# Patient Record
Sex: Male | Born: 1975
Health system: Southern US, Community
[De-identification: ages and names within clinical notes are randomized; demographics above are authoritative.]

## PROBLEM LIST (undated history)

## (undated) DIAGNOSIS — G904 Autonomic dysreflexia: Secondary | ICD-10-CM

## (undated) DIAGNOSIS — G8220 Paraplegia, unspecified: Secondary | ICD-10-CM

## (undated) DIAGNOSIS — N319 Neuromuscular dysfunction of bladder, unspecified: Secondary | ICD-10-CM

## (undated) DIAGNOSIS — S14105A Unspecified injury at C5 level of cervical spinal cord, initial encounter: Secondary | ICD-10-CM

## (undated) DIAGNOSIS — K592 Neurogenic bowel, not elsewhere classified: Secondary | ICD-10-CM

## (undated) HISTORY — PX: ABDOMINAL SURGERY: SHX537

## (undated) HISTORY — DX: Neuromuscular dysfunction of bladder, unspecified: N31.9

## (undated) HISTORY — DX: Autonomic dysreflexia: G90.4

## (undated) HISTORY — PX: BACK SURGERY: SHX140

## (undated) HISTORY — DX: Neurogenic bowel, not elsewhere classified: K59.2

---

## 1998-10-27 ENCOUNTER — Encounter: Payer: Self-pay | Admitting: Emergency Medicine

## 1998-10-27 ENCOUNTER — Inpatient Hospital Stay (HOSPITAL_COMMUNITY): Admission: EM | Admit: 1998-10-27 | Discharge: 1998-12-15 | Payer: Self-pay | Admitting: Emergency Medicine

## 1998-10-27 ENCOUNTER — Encounter: Payer: Self-pay | Admitting: Neurosurgery

## 1998-10-28 ENCOUNTER — Encounter: Payer: Self-pay | Admitting: General Surgery

## 1998-10-28 ENCOUNTER — Encounter: Payer: Self-pay | Admitting: Neurosurgery

## 1998-10-29 ENCOUNTER — Encounter: Payer: Self-pay | Admitting: General Surgery

## 1998-10-29 ENCOUNTER — Encounter: Payer: Self-pay | Admitting: Neurosurgery

## 1998-10-30 ENCOUNTER — Encounter: Payer: Self-pay | Admitting: Critical Care Medicine

## 1998-10-30 ENCOUNTER — Encounter: Payer: Self-pay | Admitting: General Surgery

## 1998-10-31 ENCOUNTER — Encounter: Payer: Self-pay | Admitting: Neurosurgery

## 1998-11-01 ENCOUNTER — Encounter: Payer: Self-pay | Admitting: Critical Care Medicine

## 1998-11-01 ENCOUNTER — Encounter: Payer: Self-pay | Admitting: Neurosurgery

## 1998-11-04 ENCOUNTER — Encounter: Payer: Self-pay | Admitting: General Surgery

## 1998-11-05 ENCOUNTER — Encounter: Payer: Self-pay | Admitting: Critical Care Medicine

## 1998-11-06 ENCOUNTER — Encounter: Payer: Self-pay | Admitting: General Surgery

## 1998-11-12 ENCOUNTER — Encounter: Payer: Self-pay | Admitting: Neurosurgery

## 1998-11-12 ENCOUNTER — Encounter: Payer: Self-pay | Admitting: General Surgery

## 1998-11-14 ENCOUNTER — Encounter: Payer: Self-pay | Admitting: General Surgery

## 1998-11-14 ENCOUNTER — Encounter: Payer: Self-pay | Admitting: Neurosurgery

## 1998-11-19 ENCOUNTER — Encounter: Payer: Self-pay | Admitting: General Surgery

## 1998-11-22 ENCOUNTER — Encounter: Payer: Self-pay | Admitting: Pulmonary Disease

## 1998-11-29 ENCOUNTER — Encounter: Payer: Self-pay | Admitting: General Surgery

## 1998-11-30 ENCOUNTER — Encounter: Payer: Self-pay | Admitting: General Surgery

## 1998-12-02 ENCOUNTER — Encounter: Payer: Self-pay | Admitting: Pulmonary Disease

## 1998-12-12 ENCOUNTER — Encounter: Payer: Self-pay | Admitting: General Surgery

## 1998-12-16 ENCOUNTER — Inpatient Hospital Stay (HOSPITAL_COMMUNITY)
Admission: RE | Admit: 1998-12-16 | Discharge: 1999-02-18 | Payer: Self-pay | Admitting: Physical Medicine and Rehabilitation

## 1999-01-01 ENCOUNTER — Encounter: Payer: Self-pay | Admitting: Physical Medicine and Rehabilitation

## 1999-01-14 ENCOUNTER — Encounter: Payer: Self-pay | Admitting: Physical Medicine and Rehabilitation

## 1999-03-28 ENCOUNTER — Encounter
Admission: RE | Admit: 1999-03-28 | Discharge: 1999-06-26 | Payer: Self-pay | Admitting: Physical Medicine and Rehabilitation

## 2004-05-06 ENCOUNTER — Encounter
Admission: RE | Admit: 2004-05-06 | Discharge: 2004-08-04 | Payer: Self-pay | Admitting: Physical Medicine & Rehabilitation

## 2004-05-07 ENCOUNTER — Ambulatory Visit: Payer: Self-pay | Admitting: Physical Medicine & Rehabilitation

## 2004-05-09 ENCOUNTER — Ambulatory Visit (HOSPITAL_COMMUNITY)
Admission: RE | Admit: 2004-05-09 | Discharge: 2004-05-09 | Payer: Self-pay | Admitting: Physical Medicine & Rehabilitation

## 2004-06-25 ENCOUNTER — Ambulatory Visit: Payer: Self-pay | Admitting: Physical Medicine & Rehabilitation

## 2004-10-20 ENCOUNTER — Encounter
Admission: RE | Admit: 2004-10-20 | Discharge: 2005-01-18 | Payer: Self-pay | Admitting: Physical Medicine & Rehabilitation

## 2004-10-22 ENCOUNTER — Ambulatory Visit: Payer: Self-pay | Admitting: Physical Medicine & Rehabilitation

## 2005-04-17 ENCOUNTER — Encounter
Admission: RE | Admit: 2005-04-17 | Discharge: 2005-07-16 | Payer: Self-pay | Admitting: Physical Medicine & Rehabilitation

## 2005-05-15 ENCOUNTER — Ambulatory Visit: Payer: Self-pay | Admitting: Physical Medicine & Rehabilitation

## 2005-10-28 ENCOUNTER — Encounter
Admission: RE | Admit: 2005-10-28 | Discharge: 2006-01-26 | Payer: Self-pay | Admitting: Physical Medicine & Rehabilitation

## 2005-11-20 ENCOUNTER — Ambulatory Visit: Payer: Self-pay | Admitting: Physical Medicine & Rehabilitation

## 2006-05-05 ENCOUNTER — Encounter
Admission: RE | Admit: 2006-05-05 | Discharge: 2006-08-03 | Payer: Self-pay | Admitting: Physical Medicine & Rehabilitation

## 2006-05-14 ENCOUNTER — Ambulatory Visit: Payer: Self-pay | Admitting: Physical Medicine & Rehabilitation

## 2006-11-10 ENCOUNTER — Encounter
Admission: RE | Admit: 2006-11-10 | Discharge: 2007-02-08 | Payer: Self-pay | Admitting: Physical Medicine & Rehabilitation

## 2006-11-10 ENCOUNTER — Ambulatory Visit: Payer: Self-pay | Admitting: Physical Medicine & Rehabilitation

## 2007-04-26 ENCOUNTER — Ambulatory Visit: Payer: Self-pay | Admitting: Physical Medicine & Rehabilitation

## 2007-04-26 ENCOUNTER — Encounter
Admission: RE | Admit: 2007-04-26 | Discharge: 2007-04-28 | Payer: Self-pay | Admitting: Physical Medicine & Rehabilitation

## 2007-10-14 ENCOUNTER — Encounter
Admission: RE | Admit: 2007-10-14 | Discharge: 2007-10-19 | Payer: Self-pay | Admitting: Physical Medicine & Rehabilitation

## 2007-10-19 ENCOUNTER — Ambulatory Visit: Payer: Self-pay | Admitting: Physical Medicine & Rehabilitation

## 2008-02-16 ENCOUNTER — Encounter
Admission: RE | Admit: 2008-02-16 | Discharge: 2008-03-15 | Payer: Self-pay | Admitting: Physical Medicine & Rehabilitation

## 2008-02-17 ENCOUNTER — Ambulatory Visit: Payer: Self-pay | Admitting: Physical Medicine & Rehabilitation

## 2008-03-15 ENCOUNTER — Ambulatory Visit: Payer: Self-pay | Admitting: Physical Medicine & Rehabilitation

## 2008-09-12 ENCOUNTER — Encounter
Admission: RE | Admit: 2008-09-12 | Discharge: 2008-09-18 | Payer: Self-pay | Admitting: Physical Medicine & Rehabilitation

## 2008-09-18 ENCOUNTER — Ambulatory Visit: Payer: Self-pay | Admitting: Physical Medicine & Rehabilitation

## 2009-03-11 ENCOUNTER — Encounter
Admission: RE | Admit: 2009-03-11 | Discharge: 2009-06-05 | Payer: Self-pay | Admitting: Physical Medicine & Rehabilitation

## 2009-03-26 ENCOUNTER — Ambulatory Visit: Payer: Self-pay | Admitting: Physical Medicine & Rehabilitation

## 2009-09-17 ENCOUNTER — Encounter
Admission: RE | Admit: 2009-09-17 | Discharge: 2009-09-20 | Payer: Self-pay | Admitting: Physical Medicine & Rehabilitation

## 2009-09-20 ENCOUNTER — Ambulatory Visit: Payer: Self-pay | Admitting: Physical Medicine & Rehabilitation

## 2010-03-03 ENCOUNTER — Encounter
Admission: RE | Admit: 2010-03-03 | Discharge: 2010-05-28 | Payer: Self-pay | Source: Home / Self Care | Attending: Internal Medicine | Admitting: Internal Medicine

## 2010-03-07 ENCOUNTER — Encounter
Admission: RE | Admit: 2010-03-07 | Discharge: 2010-03-13 | Payer: Self-pay | Source: Home / Self Care | Attending: Physical Medicine & Rehabilitation | Admitting: Physical Medicine & Rehabilitation

## 2010-03-13 ENCOUNTER — Ambulatory Visit: Payer: Self-pay | Admitting: Physical Medicine & Rehabilitation

## 2010-05-28 ENCOUNTER — Encounter: Admission: RE | Admit: 2010-05-28 | Payer: Self-pay | Source: Home / Self Care | Admitting: Internal Medicine

## 2010-09-09 ENCOUNTER — Ambulatory Visit: Payer: Medicare Other | Admitting: Physical Medicine & Rehabilitation

## 2010-09-09 ENCOUNTER — Encounter: Payer: Medicare Other | Attending: Physical Medicine & Rehabilitation
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
## 2010-10-24 NOTE — Assessment & Plan Note (Signed)
[name] returns to clinic today for followup evaluation.  He reports  that overall he continues to do well.  He is being fitted for a new power  wheelchair, but no other equipment issues are pending at this time.  He has  finished his semester in his computer college course through [employer].  He is taking psychology and finished with a 3.25 grade point  average.  He reports he will be taking classes this summer.   In terms of the skin, he reports no breakdown.   In terms of his bladder, continues to use briefs and changes two to three  times per day.  He is having no significant urinary tract infections.   In terms of his bowels, he uses a suppository and has daily bowel movements.   During the last clinic visit, we had added Tramadol to be used on an as-  needed basis for his low back pain.  He reports that he takes a very minimal  amount and still has approximately 30 tablets left from the prescription of  90 given to him June 25, 2004.  He is very sporadic in his use but  occasionally does use it on a daily basis but for only a few days at a time.  At other times, he goes two and three weeks without using any medication.   MEDICATIONS:  1.  Tramadol 50 mg 1 tablet t.i.d. p.r.n. (0 to 3 per day).  2.  Advil p.r.n.   CLINICAL EXAMINATION:  GENERAL:  Well-appearing, fit, adult male seated in a  power wheelchair.  VITAL SIGNS:  Blood pressure 75/49, pulse 67, respiratory rate 16, O2  saturation 97% on room air.  NEUROLOGIC:  He has 4+/5 strength at the shoulders bilaterally.  Biceps were  4+/5, and grip was 1/5 along with triceps 0/5.  Lower extremity strength was  0/5 throughout.   IMPRESSION:  1.  History of cervical fracture at C5 with resultant complete quadriplegia.  2.  Probable mechanical low back pain.   PLAN:  1.  In the office today, the patient does not need a refill on Ultram.  He      will call me for refill as he gets down, and then we will refill  it by      phone.  2.  We will plan on seeing him in followup in approximately six months time.      He continues to have ongoing medical issues and is rather stable from      his cervical spinal cord injury.      DC/MedQ  D:  10/22/2004 12:36:08  T:  10/22/2004 12:52:25  Job #:  [id number]

## 2010-10-24 NOTE — Assessment & Plan Note (Signed)
INTERVAL HISTORY:  Christopher Valencia returns to clinic today for followup  evaluation.  He reports that he is doing well overall.  He continues to take  classes through computer-based program at [employer] and he has  approximately 40 of 120 credits that he needs for a psychology degree.  He  also plans to attend graduate school if his grade point average is up to a  certain level.   In terms of his bowel and bladder, he reports that all function is normal  for him.  He continues to use briefs and change them two to three times per  day.  He uses a suppository for daily bowel program.  He continues to have  no problems with his skin and uses a power chair.  He does have help from a  caregiver who is actually his brother through the [employer] program.  The brother  is in for him and helps him 7 hours per day, 5 days per week.   The patient does continue to take the tramadol anywhere from 0 to 3 tablets  per day.   MEDICATIONS:  1.  Tramadol 50 mg one tablet t.i.d. p.r.n. (0-3 per day).  2.  Advil p.r.n.   PHYSICAL EXAMINATION:  Reasonably well-appearing fit adult male seated in a  power wheelchair.  Blood pressure 130/62 with the pulse 64, respiratory rate  16 and O2 saturation 93% on room air.  4+/5 strength was present in his  bilateral shoulders with biceps strength of 4+/5 and grip of 1/5.  Lower  extremity strength was 0/5 throughout.   IMPRESSION:  1.  History of cervical fracture C5 with resultant complete quadriplegia.  2.  Mechanical low back pain.   In the office today, no refill on medication is necessary.  We will plan on  seeing him in followup in approximately 6 months' time with refill of  medication prior to that appointment as necessary.           ______________________________  Ellwood Dense, M.D.     DC/MedQ  D:  05/15/2005 15:04:32  T:  05/15/2005 21:17:35  Job #:  [id number]
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
## 2010-10-24 NOTE — Assessment & Plan Note (Signed)
Christopher Valencia returns to clinic today for follow up evaluation. He reports  that he is doing well over all. He continues to finish this recent  semester in computer classes that he is taking through [employer]. He is doing well overall.  His health is unchanged. His is urinating on his own and having regular  bowel movements on his own. He reports that he has had no skin breakdown  and his equipment is working well for him at the present time. He does  use tramadol only on periodic basis i.e. up to 1 tablet per day and he  has sufficient supply at this time.   MEDICATIONS:  1. Tramadol 50 mg 1 tablet t.i.d. p.r.n.0 to 1 per day.   REVIEW OF SYSTEMS:  Noncontributory.   PHYSICAL EXAMINATION:  Well-appearing fit, adult male sitting in a power  wheelchair. Blood pressure 104/62, with a pulse of 57, respiratory rate  of 16, and O2 saturation 97% on room air. He has 4+/5 strength  proximally, and 1/5 strength in his bilateral upper extremities. Lower  extremity strength was 0/5.   IMPRESSION:  1. History of cervical fracture at C5 with result in complete      quadriplegia.  2. Mechanical low back pain.   In the office today no refill of medications is necessary. He is doing  well overall. No need in change of his bowel or bladder program or any  treatment for skin breakdown. We will plan on seeing him in followup in  approximately 6 months time.           ______________________________  Ellwood Dense, M.D.     DC/MedQ  D:  05/17/2006 11:33:50  T:  05/17/2006 15:46:05  Job #:  [id number]

## 2010-10-24 NOTE — Assessment & Plan Note (Signed)
MEDICAL RECORD NUMBER:  [id number].   [name] returns to clinic today for followup evaluation. During the last  clinic visit May 07, 2004, the patient had reported good relief with  only minimal use of Advil. No other prescriptions were given at that time.  He does repot now that the Advil is helping most of the time, but he would  like something to be used on days when the Advil does not give him relief.  He also reports that he has used some Skelaxin but that has not be helpful  for him in the past. He did undergo a recent MRI scan of his lumbar spine  May 09, 2004. He was given a copy of that in the office today. The  impression was annular rent on the left at L5-S1 but no associated disk  protrusion. There was no focal disk protrusion, spinal, or foraminal  stenosis in the lumbar spine. There was no evidence of sacral stress  fracture or SI joint abnormality.   MEDICATIONS:  Advil p.r.n.   PHYSICAL EXAMINATION:  Reasonably well appearing adult male seated in power  wheelchair. Blood pressure 90/50 with a pulse of 63, respiratory rate 16,  and O2 saturation 97% on room air. He has 4+/5 strength at the shoulders  bilaterally. Biceps were 4+/5, and the wrist was 4/5. Grip was 1 to 1/5 and  triceps 0/5. Lower extremity strength was 0/5 throughout.   IMPRESSION:  1.  History of cervical fracture at C5 with resultant complete quadriplegia.  2.  Probable mechanical low back pain.   At this point, the MRI scan is fairly unremarkable for this individual. We  have had him continue the Advil, but I have given him a new prescription for  Tramadol to be used 1 tablet 3 times a day as needed, 50 mg. He continues to  have no changes in his bowel or bladder function. He is regularly voiding on  his own and has regular bowel movements at this point. We will plan on  seeing in followup in approximately four months' time or before if  necessary.       DC/MedQ  D:  06/25/2004  14:05:40  T:  06/25/2004 14:50:55  Job #:  [id number]

## 2010-12-16 ENCOUNTER — Encounter: Payer: Medicare Other | Attending: Neurosurgery | Admitting: Neurosurgery

## 2010-12-16 DIAGNOSIS — M545 Low back pain, unspecified: Secondary | ICD-10-CM

## 2010-12-16 DIAGNOSIS — X58XXXS Exposure to other specified factors, sequela: Secondary | ICD-10-CM | POA: Insufficient documentation

## 2010-12-16 DIAGNOSIS — N319 Neuromuscular dysfunction of bladder, unspecified: Secondary | ICD-10-CM | POA: Insufficient documentation

## 2010-12-16 DIAGNOSIS — Z981 Arthrodesis status: Secondary | ICD-10-CM | POA: Insufficient documentation

## 2010-12-16 DIAGNOSIS — G8254 Quadriplegia, C5-C7 incomplete: Secondary | ICD-10-CM | POA: Insufficient documentation

## 2010-12-16 DIAGNOSIS — K592 Neurogenic bowel, not elsewhere classified: Secondary | ICD-10-CM | POA: Insufficient documentation

## 2010-12-16 DIAGNOSIS — IMO0002 Reserved for concepts with insufficient information to code with codable children: Secondary | ICD-10-CM | POA: Insufficient documentation

## 2010-12-17 NOTE — Assessment & Plan Note (Signed)
A 35 year old male with a history of a C6 incomplete quadriplegia is followed by Dr. Wynn Banker for a low back pain.  He has had a C3-6 fusion with Dr. Phoebe Perch in the past.  Otherwise, he reports no change in his condition.  He takes tramadol and does okay with that.  His pain level is about 2-3, sharp pain, that is consistent.  Sitting in activity tend to aggravate.  Therapy, rest, and medication tend to help.  Pain is worse in the daytime and the evening.  Mobility is in a wheelchair motorized.  REVIEW OF SYSTEMS:  Notable for difficulties described above as well as some bowel and bladder problems, some weakness and paresthesias, spasms. No suicidal thoughts or aberrant behaviors.  PAST MEDICAL HISTORY, SOCIAL HISTORY AND FAMILY HISTORY:  Unchanged.  Physical exam; his blood pressure is 81/48, pulse 63, respirations 18, O2 sats 99 on room air.  He is about 2/5 in his triceps, 4/5 in the biceps, nothing in the lower extremities.  All sensation is diminished in upper and lower.  Constitutionally, he is within normal limits. Alert and oriented x3.  Affect is bright.  He is in a motorized chair.  IMPRESSION:  C6 incomplete spinal cord injury and neurogenic bowel and bladder.  PLAN: 1. Continue his tramadol 50 mg q.i.d. for back pain 120 5 refills. 2. Continue his other medications. 3. Follow up with Urology for bladder problems. 4. He will follow up here in the clinic in 6 months.  His questions     were encouraged and answered.     Zelda Reames L. Blima Dessert Electronically Signed    RLW/MedQ D:  12/16/2010 13:05:42  T:  12/17/2010 02:01:43  Job #:  [id number]

## 2011-05-13 ENCOUNTER — Encounter: Payer: Self-pay | Admitting: Family Medicine

## 2011-05-13 ENCOUNTER — Emergency Department (HOSPITAL_BASED_OUTPATIENT_CLINIC_OR_DEPARTMENT_OTHER)
Admission: EM | Admit: 2011-05-13 | Discharge: 2011-05-13 | Disposition: A | Discharge status: Home / Self Care | Payer: Medicare Other | Attending: Emergency Medicine | Admitting: Emergency Medicine

## 2011-05-13 DIAGNOSIS — R197 Diarrhea, unspecified: Secondary | ICD-10-CM

## 2011-05-13 HISTORY — DX: Unspecified injury at C5 level of cervical spinal cord, initial encounter: S14.105A

## 2011-05-13 LAB — COMPREHENSIVE METABOLIC PANEL
AST: 21 U/L (ref 0–37)
CO2: 24 mEq/L (ref 19–32)
Calcium: 9.1 mg/dL (ref 8.4–10.5)
Creatinine, Ser: 0.7 mg/dL (ref 0.50–1.35)
GFR calc Af Amer: >90 mL/min (ref >90)
GFR calc non Af Amer: >90 mL/min (ref >90)
Glucose, Bld: 80 mg/dL (ref 70–99)
Total Protein: 7 g/dL (ref 6.0–8.3)

## 2011-05-13 LAB — URINALYSIS, ROUTINE W REFLEX MICROSCOPIC
Nitrite: NEGATIVE
Specific Gravity, Urine: 1.015 (ref 1.005–1.030)
Urobilinogen, UA: 0.2 mg/dL (ref 0.0–1.0)
pH: 7 (ref 5.0–8.0)

## 2011-05-13 LAB — DIFFERENTIAL
Basophils Absolute: 0 10*3/uL (ref 0.0–0.1)
Eosinophils Absolute: 0.2 10*3/uL (ref 0.0–0.7)
Eosinophils Relative: 3 % (ref 0–5)
Lymphocytes Relative: 22 % (ref 12–46)
Monocytes Absolute: 0.5 10*3/uL (ref 0.1–1.0)

## 2011-05-13 LAB — CBC
HCT: 38.4 % — ABNORMAL LOW (ref 39.0–52.0)
MCH: 27 pg (ref 26.0–34.0)
MCV: 81 fL (ref 78.0–100.0)
Platelets: 222 10*3/uL (ref 150–400)
RDW: 14.8 % (ref 11.5–15.5)
WBC: 4.8 10*3/uL (ref 4.0–10.5)

## 2011-05-13 MED ORDER — SODIUM CHLORIDE 0.9 % IV BOLUS (SEPSIS)
1000.0000 mL | Freq: Once | INTRAVENOUS | Status: AC
  Administered 2011-05-13: 1000 mL via INTRAVENOUS

## 2011-05-13 NOTE — ED Provider Notes (Signed)
Medical screening examination/treatment/procedure(s) were conducted as a shared visit with non-physician practitioner(s) and myself.  I personally evaluated the patient during the encounter   Christopher Valencia A. Patrica Duel, MD 05/13/11 [phone]

## 2011-05-13 NOTE — ED Notes (Signed)
Pt c/o "diarrhea x 4 days and my CNA noticed blood in it this morning". Pt sts he recently started taking fish oil and vitamins about a week ago. Pt denies n/v, fever.

## 2011-05-13 NOTE — ED Notes (Signed)
Pt had no diarrhea while in ED
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
## 2011-05-13 NOTE — ED Provider Notes (Signed)
History     CSN: [id number] Arrival date & time: 05/13/2011  1:41 PM   First MD Initiated Contact with Patient 05/13/11 1349      Chief Complaint  Patient presents with  . Diarrhea    (Consider location/radiation/quality/duration/timing/severity/associated sxs/prior treatment) Patient is a 35 y.o. male presenting with diarrhea. The history is provided by the patient. No language interpreter was used.  Diarrhea The primary symptoms include diarrhea and melena. Primary symptoms do not include nausea or vomiting. The illness began 3 to 5 days ago. The onset was gradual. The problem has been gradually worsening.  The diarrhea began today. The diarrhea is watery. The diarrhea occurs 5 to 10 times per day. Risk factors for illness producing diarrhea include new medications.  The illness is also significant for chills. The illness does not include constipation. Associated medical issues do not include GERD. Risk factors: none.    Past Medical History  Diagnosis Date  . Spinal cord injury, C5-C7     Past Surgical History  Procedure Date  . Abdominal surgery   . Back surgery     No family history on file.  History  Substance Use Topics  . Smoking status: Never Smoker   . Smokeless tobacco: Not on file  . Alcohol Use: No      Review of Systems  Constitutional: Positive for chills.  Gastrointestinal: Positive for diarrhea and melena. Negative for nausea, vomiting and constipation.  All other systems reviewed and are negative.    Allergies  Review of patient's allergies indicates no known allergies.  Home Medications   Current Outpatient Rx  Name Route Sig Dispense Refill  . TRAMADOL HCL 50 MG PO TABS Oral Take 50 mg by mouth every 6 (six) hours as needed. Maximum dose= 8 tablets per day       BP 92/57  Pulse 56  Temp(Src) 98.9 F (37.2 C) (Oral)  Resp 18  Ht 5\' 10"  (1.778 m)  Wt 185 lb (83.915 kg)  BMI 26.54 kg/m2  SpO2 97%  Physical Exam  Nursing note and  vitals reviewed. Constitutional: He appears well-developed and well-nourished.  HENT:  Head: Normocephalic and atraumatic.  Right Ear: External ear normal.  Left Ear: External ear normal.  Nose: Nose normal.  Mouth/Throat: Oropharynx is clear and moist.  Eyes: Conjunctivae are normal. Pupils are equal, round, and reactive to light.  Neck: Normal range of motion.  Cardiovascular: Normal rate.   Pulmonary/Chest: Effort normal and breath sounds normal.  Abdominal: Soft. Bowel sounds are normal. There is no tenderness.  Musculoskeletal: Normal range of motion.  Neurological: He is alert.  Skin: Skin is warm.  Psychiatric: He has a normal mood and affect.    ED Course  Procedures (including critical care time)  Labs Reviewed  CBC - Abnormal; Notable for the following:    Hemoglobin 12.8 (*)    HCT 38.4 (*)    All other components within normal limits  COMPREHENSIVE METABOLIC PANEL - Abnormal; Notable for the following:    Total Bilirubin 0.2 (*)    All other components within normal limits  DIFFERENTIAL  OCCULT BLOOD X 1 CARD TO LAB, STOOL  URINALYSIS, ROUTINE W REFLEX MICROSCOPIC   No results found.   No diagnosis found.    MDM  Pt given Iv fluids,  No sign of dehydration.  No diarrhea while here in ED. Pt took imodium at home.         Langston Masker, [place] 05/13/11 1835  Langston Masker,  [place] 05/13/11 1837

## 2011-05-13 NOTE — ED Notes (Signed)
Per  Family no diarea noted

## 2011-06-12 ENCOUNTER — Ambulatory Visit: Payer: Medicare Other | Admitting: Physical Medicine & Rehabilitation

## 2011-06-15 ENCOUNTER — Ambulatory Visit: Payer: Medicare Other | Admitting: Physical Medicine & Rehabilitation

## 2011-06-15 ENCOUNTER — Encounter: Payer: Medicare Other | Attending: Physical Medicine & Rehabilitation

## 2011-06-15 DIAGNOSIS — IMO0002 Reserved for concepts with insufficient information to code with codable children: Secondary | ICD-10-CM | POA: Insufficient documentation

## 2011-06-15 DIAGNOSIS — K592 Neurogenic bowel, not elsewhere classified: Secondary | ICD-10-CM | POA: Insufficient documentation

## 2011-06-15 DIAGNOSIS — X58XXXS Exposure to other specified factors, sequela: Secondary | ICD-10-CM | POA: Insufficient documentation

## 2011-06-15 DIAGNOSIS — N319 Neuromuscular dysfunction of bladder, unspecified: Secondary | ICD-10-CM | POA: Insufficient documentation

## 2011-06-15 DIAGNOSIS — G8253 Quadriplegia, C5-C7 complete: Secondary | ICD-10-CM | POA: Insufficient documentation

## 2011-06-15 NOTE — Assessment & Plan Note (Signed)
REASON FOR VISIT:  Quadriplegia.  A 36 year old male with C6 complete spinal cord injury with chronic quadriplegia.  He had a upper respiratory infection in last couple weeks, but responded well to an antibiotic prescribed by a primary care physician.  He has had no falls.  He is to make an appointment with the Urology to get yearly ultrasound.  He has no significant pain right now about 2/10, although he states he average about 5/10.  Does respond to medications.  His pain is described as sharp, burning, stabbing, constant in the low back area.  He is in a wheelchair, pretty much all day.  He needs assistance with dressing, bathing, toileting, meal prep.  REVIEW OF SYSTEMS:  Positive for bladder and bowel control issues related to his spinal cord injury.  Numbness, tingling and spasms.  OBJECTIVE:  VITAL SIGNS:  His blood pressure 95/55, pulse 62, respiratory rate is 16, O2 sat 99% on room air.  GENERAL:  No acute distress.  Mood and affect appropriate. EXTREMITIES:  His examination reveals C6 motor level.  He does have normal deltoid and biceps are a 4/5.  Triceps are nil.  Wrist extensor is 4 on the right and 3 on the left.  Finger flexion and wrist flexion is nil.  Lower extremity strength is absent.  His tone appears to be normal.  He has no evidence of clonus in the lower extremities.  No hyperactive reflexes.  Passive range of motion.  Ashworth grade 1.  IMPRESSION:  C6 quadriplegia chronic with neurogenic bowel and bladder. We reviewed several things today including his Urology status, his pain management, which is adequate on tramadol 50 q.i.d. as well as looked at his equipment needs.  At this point, he just needs a new lift on his Zenaida Niece.  I have written a prescription and he will submit to insurance after he discusses with the lift manufacture.  I discussed with the patient, agrees with plan.  I will see him back in 6 months.     Erick Colace, M.D. Electronically  Signed    AEK/MedQ D:  06/15/2011 15:03:00  T:  06/15/2011 19:50:04  Job #:  [id number]

## 2011-12-22 ENCOUNTER — Ambulatory Visit: Payer: Medicare Other | Admitting: Physical Medicine & Rehabilitation

## 2011-12-29 ENCOUNTER — Ambulatory Visit: Payer: Medicare Other | Admitting: Physical Medicine & Rehabilitation

## 2012-01-01 ENCOUNTER — Ambulatory Visit (HOSPITAL_BASED_OUTPATIENT_CLINIC_OR_DEPARTMENT_OTHER): Payer: Medicare Other | Admitting: Physical Medicine & Rehabilitation

## 2012-01-01 ENCOUNTER — Encounter: Payer: Self-pay | Admitting: Physical Medicine & Rehabilitation

## 2012-01-01 ENCOUNTER — Encounter: Payer: Medicare Other | Attending: Physical Medicine & Rehabilitation

## 2012-01-01 VITALS — BP 86/44 | HR 80 | Ht 70.0 in | Wt 190.0 lb

## 2012-01-01 DIAGNOSIS — IMO0002 Reserved for concepts with insufficient information to code with codable children: Secondary | ICD-10-CM | POA: Insufficient documentation

## 2012-01-01 DIAGNOSIS — G8250 Quadriplegia, unspecified: Secondary | ICD-10-CM | POA: Insufficient documentation

## 2012-01-01 DIAGNOSIS — S14106A Unspecified injury at C6 level of cervical spinal cord, initial encounter: Secondary | ICD-10-CM | POA: Insufficient documentation

## 2012-01-01 DIAGNOSIS — S14105A Unspecified injury at C5 level of cervical spinal cord, initial encounter: Secondary | ICD-10-CM

## 2012-01-01 DIAGNOSIS — X58XXXS Exposure to other specified factors, sequela: Secondary | ICD-10-CM | POA: Insufficient documentation

## 2012-01-01 MED ORDER — TRAMADOL HCL 50 MG PO TABS: 50.0000 mg | ORAL_TABLET | Freq: Four times a day (QID) | ORAL | Status: DC | PRN

## 2012-01-01 NOTE — Patient Instructions (Addendum)
Continue tramadol See me back in 9 months Please call if you have any equipment needs before that time

## 2012-01-01 NOTE — Progress Notes (Signed)
  Subjective:    Patient ID: Christopher Valencia, male    DOB: 07-27-1975, 36 y.o.   MRN: [id number]  HPI A 36 year old male with C6 (2006) complete spinal cord injury with chronic  quadriplegia. He had a upper respiratory infection in last couple  weeks, but responded well to an antibiotic prescribed by a primary care  physician. He has had no falls. He is to make an appointment with the  Urology to get yearly ultrasound. He has no significant pain right now  about 2/10, although he states he average about 5/10. Does respond to  medications. His pain is described as sharp, burning, stabbing,  constant in the low back area. He is in a wheelchair, pretty much all  day. He needs assistance with dressing, bathing, toileting, meal prep.   Tramadol 1-4 tabs per day  Pain Inventory Average Pain 2 Pain Right Now 2 My pain is intermittent, aching and throbbing  In the last 24 hours, has pain interfered with the following? General activity 2 Relation with others 0 Enjoyment of life 1 What TIME of day is your pain at its worst? daytime and evening Sleep (in general) Good  Pain is worse with: sitting and inactivity Pain improves with: rest, therapy/exercise and medication Relief from Meds: 6  Mobility use a wheelchair  Function disabled: date disabled 2000 I need assistance with the following:  dressing, bathing, toileting, meal prep and household duties  Neuro/Psych bladder control problems bowel control problems weakness numbness tremor tingling spasms  Prior Studies Any changes since last visit?  no  Physicians involved in your care Any changes since last visit?  no   History reviewed. No pertinent family history. History   Social History  . Marital Status: Single    Spouse Name: N/A    Number of Children: N/A  . Years of Education: N/A   Social History Main Topics  . Smoking status: Never Smoker   . Smokeless tobacco: Never Used  . Alcohol Use: No  . Drug Use: No    . Sexually Active: None   Other Topics Concern  . None   Social History Narrative  . None   Past Surgical History  Procedure Date  . Abdominal surgery   . Back surgery    Past Medical History  Diagnosis Date  . Spinal cord injury, C5-C7    BP 86/44  Pulse 80  Ht 5\' 10"  (1.778 m)  Wt 190 lb (86.183 kg)  BMI 27.26 kg/m2  SpO2 98%    Review of Systems  All other systems reviewed and are negative.       Objective:   Physical Exam  Constitutional: He is oriented to person, place, and time. He appears well-developed and well-nourished.  HENT:  Head: Normocephalic and atraumatic.  Neurological: He is alert and oriented to person, place, and time. A sensory deficit is present. Coordination and gait abnormal.       C6 motor level week or on the left than on right side Incomplete sensory level has good sensation on the right side but reduced on the left side low C6  Psychiatric: He has a normal mood and affect.          Assessment & Plan:  1. C6 [place] B. Cervical spinal cord injury due to trauma in 2000. Patient is wheelchair dependent. Pain is well controlled with tramadol. No new equipment needs May need a new mattress for hospital bed Return to clinic in 9 months

## 2012-09-26 ENCOUNTER — Encounter: Payer: Medicare Other | Attending: Physical Medicine & Rehabilitation

## 2012-09-26 ENCOUNTER — Encounter: Payer: Self-pay | Admitting: Physical Medicine & Rehabilitation

## 2012-09-26 ENCOUNTER — Ambulatory Visit (HOSPITAL_BASED_OUTPATIENT_CLINIC_OR_DEPARTMENT_OTHER): Payer: Medicare Other | Admitting: Physical Medicine & Rehabilitation

## 2012-09-26 VITALS — BP 77/42 | HR 64 | Resp 14 | Ht 70.0 in | Wt 190.0 lb

## 2012-09-26 DIAGNOSIS — S14106D Unspecified injury at C6 level of cervical spinal cord, subsequent encounter: Secondary | ICD-10-CM

## 2012-09-26 DIAGNOSIS — Z993 Dependence on wheelchair: Secondary | ICD-10-CM | POA: Insufficient documentation

## 2012-09-26 DIAGNOSIS — G8929 Other chronic pain: Secondary | ICD-10-CM | POA: Insufficient documentation

## 2012-09-26 DIAGNOSIS — Z79899 Other long term (current) drug therapy: Secondary | ICD-10-CM | POA: Insufficient documentation

## 2012-09-26 DIAGNOSIS — Z5189 Encounter for other specified aftercare: Secondary | ICD-10-CM

## 2012-09-26 DIAGNOSIS — G8253 Quadriplegia, C5-C7 complete: Secondary | ICD-10-CM | POA: Insufficient documentation

## 2012-09-26 DIAGNOSIS — M549 Dorsalgia, unspecified: Secondary | ICD-10-CM | POA: Insufficient documentation

## 2012-09-26 MED ORDER — TRAMADOL HCL 50 MG PO TABS: 50.0000 mg | ORAL_TABLET | Freq: Four times a day (QID) | ORAL | Status: DC | PRN

## 2012-09-26 NOTE — Progress Notes (Signed)
Subjective:    Patient ID: Christopher Valencia, male    DOB: [birth year], 37 y.o.   MRN: [id number]  HPI A 37 year old male with C6 (2006) complete spinal cord injury with chronic  quadriplegia. He had a upper respiratory infection in last couple  weeks, but responded well to an antibiotic prescribed by a primary care  physician. He has had no falls. He is to make an appointment with the  Urology to get yearly ultrasound. He has no significant pain right now  about 2/10, although he states he average about 5/10. Does respond to  medications. His pain is described as sharp, burning, stabbing,  constant in the low back area. He is in a wheelchair, pretty much all  day. He needs assistance with dressing, bathing, toileting, meal prep.  Tramadol 1-6 tabs per day no every day  CNA to help with ADLs Pressure reliefs, has some sensation in legs and buttocks Pain Inventory Average Pain 4 Pain Right Now 2 My pain is constant, sharp, burning, dull, stabbing, tingling and aching  In the last 24 hours, has pain interfered with the following? General activity 1 Relation with others 0 Enjoyment of life 1 What TIME of day is your pain at its worst? all the time Sleep (in general) Fair  Pain is worse with: some activites Pain improves with: rest, pacing activities and medication Relief from Meds: 6  Mobility use a wheelchair  Function disabled: date disabled 2000 I need assistance with the following:  feeding, dressing, bathing, toileting, meal prep, household duties and shopping  Neuro/Psych No problems in this area  Prior Studies Any changes since last visit?  no  Physicians involved in your care Any changes since last visit?  no   History reviewed. No pertinent family history. History   Social History  . Marital Status: Single    Spouse Name: N/A    Number of Children: N/A  . Years of Education: N/A   Social History Main Topics  . Smoking status: Never Smoker   . Smokeless  tobacco: Never Used  . Alcohol Use: No  . Drug Use: No  . Sexually Active: None   Other Topics Concern  . None   Social History Narrative  . None   Past Surgical History  Procedure Laterality Date  . Abdominal surgery    . Back surgery     Past Medical History  Diagnosis Date  . Spinal cord injury, C5-C7    BP 77/42  Pulse 64  Resp 14  Ht 5\' 10"  (1.778 m)  Wt 190 lb (86.183 kg)  BMI 27.26 kg/m2  SpO2 94%     Review of Systems  Genitourinary: Positive for difficulty urinating.  Musculoskeletal: Positive for back pain.  All other systems reviewed and are negative.       Objective:   Physical Exam  Constitutional: He is oriented to person, place, and time. He appears well-developed and well-nourished.  HENT:  Head: Normocephalic and atraumatic.  Neurological: He is alert and oriented to person, place, and time. A sensory deficit is present. Coordination and gait abnormal.  C6 motor level weak or on the left than on right side Incomplete sensory level has good sensation on the right side but reduced on the left side below C6  Psychiatric: He has a normal mood and affect.        Assessment & Plan:  1. C6 [place] B. Cervical spinal cord injury due to trauma in 2000. Patient is wheelchair dependent.  Pain is well controlled with tramadol.  No new equipment needs  May need a new mattress for hospital bed  Return to clinic in 12 months

## 2012-11-08 ENCOUNTER — Emergency Department (HOSPITAL_BASED_OUTPATIENT_CLINIC_OR_DEPARTMENT_OTHER): Payer: Medicare Other

## 2012-11-08 ENCOUNTER — Encounter (HOSPITAL_BASED_OUTPATIENT_CLINIC_OR_DEPARTMENT_OTHER): Payer: Self-pay

## 2012-11-08 ENCOUNTER — Emergency Department (HOSPITAL_BASED_OUTPATIENT_CLINIC_OR_DEPARTMENT_OTHER)
Admission: EM | Admit: 2012-11-08 | Discharge: 2012-11-08 | Disposition: A | Discharge status: Home / Self Care | Payer: Medicare Other | Attending: Emergency Medicine | Admitting: Emergency Medicine

## 2012-11-08 DIAGNOSIS — R071 Chest pain on breathing: Secondary | ICD-10-CM | POA: Insufficient documentation

## 2012-11-08 DIAGNOSIS — Z8669 Personal history of other diseases of the nervous system and sense organs: Secondary | ICD-10-CM | POA: Insufficient documentation

## 2012-11-08 DIAGNOSIS — R0789 Other chest pain: Secondary | ICD-10-CM

## 2012-11-08 DIAGNOSIS — Z87828 Personal history of other (healed) physical injury and trauma: Secondary | ICD-10-CM | POA: Insufficient documentation

## 2012-11-08 HISTORY — DX: Paraplegia, unspecified: G82.20

## 2012-11-08 LAB — URINALYSIS, ROUTINE W REFLEX MICROSCOPIC
Leukocytes, UA: NEGATIVE
Nitrite: NEGATIVE
Protein, ur: NEGATIVE mg/dL
Urobilinogen, UA: 0.2 mg/dL (ref 0.0–1.0)

## 2012-11-08 LAB — URINE MICROSCOPIC-ADD ON

## 2012-11-08 MED ORDER — HYDROCODONE-ACETAMINOPHEN 5-325 MG PO TABS: 2.0000 | ORAL_TABLET | ORAL | Status: DC | PRN

## 2012-11-08 NOTE — ED Notes (Signed)
MD at bedside. 
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
## 2012-11-08 NOTE — ED Notes (Signed)
Pt states he called PCP with c/o today-muscle relaxer called in-pt reports no relief from spasms

## 2012-11-08 NOTE — ED Notes (Addendum)
C/o left CP started last week-started again last night-pt states pain is with muscle spasms and when moves left arm and also when voids b/c he has muscles spasms when voids-pt is paraplegic in w/c

## 2012-11-08 NOTE — ED Notes (Signed)
Patient back from  X-ray 

## 2013-03-13 ENCOUNTER — Emergency Department (HOSPITAL_BASED_OUTPATIENT_CLINIC_OR_DEPARTMENT_OTHER)
Admission: EM | Admit: 2013-03-13 | Discharge: 2013-03-13 | Disposition: A | Discharge status: Home / Self Care | Payer: Medicare Other | Attending: Emergency Medicine | Admitting: Emergency Medicine

## 2013-03-13 ENCOUNTER — Encounter (HOSPITAL_BASED_OUTPATIENT_CLINIC_OR_DEPARTMENT_OTHER): Payer: Self-pay

## 2013-03-13 DIAGNOSIS — N390 Urinary tract infection, site not specified: Secondary | ICD-10-CM | POA: Insufficient documentation

## 2013-03-13 DIAGNOSIS — Z8669 Personal history of other diseases of the nervous system and sense organs: Secondary | ICD-10-CM | POA: Insufficient documentation

## 2013-03-13 DIAGNOSIS — Z79899 Other long term (current) drug therapy: Secondary | ICD-10-CM | POA: Insufficient documentation

## 2013-03-13 LAB — URINALYSIS, ROUTINE W REFLEX MICROSCOPIC
Bilirubin Urine: NEGATIVE
Glucose, UA: NEGATIVE mg/dL
Ketones, ur: 15 mg/dL — AB
Protein, ur: 100 mg/dL — AB

## 2013-03-13 LAB — URINE MICROSCOPIC-ADD ON

## 2013-03-13 MED ORDER — CEPHALEXIN 500 MG PO CAPS: 500.0000 mg | ORAL_CAPSULE | Freq: Four times a day (QID) | ORAL | Status: DC

## 2013-03-13 NOTE — ED Notes (Signed)
C/o UTI with hematuria, and frequent urination. "Everytime I get UTI, I have blood in urine"

## 2013-03-13 NOTE — ED Notes (Signed)
np at bedside

## 2013-03-13 NOTE — ED Provider Notes (Addendum)
CSN: [id number]     Arrival date & time 03/13/13  1438 History   First MD Initiated Contact with Patient 03/13/13 1446     Chief Complaint  Patient presents with  . Urinary Tract Infection   (Consider location/radiation/quality/duration/timing/severity/associated sxs/prior Treatment) HPI Comments: Pt states that when he notices blood in his urine he always has uti:pt states that he has some burning with urination:his urologist couldn't see him today:denies fever, vomiting abdominal pain  Patient is a 37 y.o. male presenting with urinary tract infection. The history is provided by the patient. No language interpreter was used.  Urinary Tract Infection This is a recurrent problem. The current episode started today. The problem occurs constantly. The problem has been unchanged. Pertinent negatives include no abdominal pain, fever or weakness. Nothing aggravates the symptoms. He has tried nothing for the symptoms.    Past Medical History  Diagnosis Date  . Spinal cord injury, C5-C7   . Paraplegia following spinal cord injury    Past Surgical History  Procedure Laterality Date  . Abdominal surgery    . Back surgery     No family history on file. History  Substance Use Topics  . Smoking status: Never Smoker   . Smokeless tobacco: Never Used  . Alcohol Use: No    Review of Systems  Constitutional: Negative for fever.  Respiratory: Negative.   Cardiovascular: Negative.   Gastrointestinal: Negative for abdominal pain.  Genitourinary: Positive for hematuria.  Neurological: Negative for weakness.    Allergies  Review of patient's allergies indicates no known allergies.  Home Medications   Current Outpatient Rx  Name  Route  Sig  Dispense  Refill  . baclofen (LIORESAL) 10 MG tablet   Oral   Take 10 mg by mouth 3 (three) times daily.         Marland Kitchen HYDROcodone-acetaminophen (NORCO) 5-325 MG per tablet   Oral   Take 2 tablets by mouth every 4 (four) hours as needed for pain.  20 tablet   0   . traMADol (ULTRAM) 50 MG tablet   Oral   Take 1 tablet (50 mg total) by mouth every 6 (six) hours as needed. Maximum dose= 8 tablets per day   120 tablet   11    BP 104/62  Pulse 78  Temp(Src) 99.7 F (37.6 C) (Oral)  Resp 18  SpO2 96% Physical Exam  Nursing note and vitals reviewed. HENT:  Head: Normocephalic and atraumatic.  Cardiovascular: Normal rate and regular rhythm.   Pulmonary/Chest: Effort normal and breath sounds normal.  Abdominal: Soft. Bowel sounds are normal.    ED Course  Procedures (including critical care time) Labs Review Labs Reviewed  URINALYSIS, ROUTINE W REFLEX MICROSCOPIC - Abnormal; Notable for the following:    Color, Urine RED (*)    APPearance TURBID (*)    Hgb urine dipstick LARGE (*)    Ketones, ur 15 (*)    Protein, ur 100 (*)    Leukocytes, UA LARGE (*)    All other components within normal limits  URINE MICROSCOPIC-ADD ON - Abnormal; Notable for the following:    Squamous Epithelial / LPF FEW (*)    Bacteria, UA FEW (*)    All other components within normal limits  URINE CULTURE   Imaging Review No results found.  MDM   1. UTI (lower urinary tract infection)    Pt instructed on follow up with pcp or urology for worsening symptoms   Teressa Lower, NP 03/13/13 1524  Teressa Lower, NP 03/24/13 1505

## 2013-03-14 NOTE — ED Provider Notes (Signed)
Medical screening examination/treatment/procedure(s) were performed by non-physician practitioner and as supervising physician I was immediately available for consultation/collaboration.   Candyce Churn, MD 03/14/13 [phone]

## 2013-03-17 LAB — URINE CULTURE

## 2013-03-18 ENCOUNTER — Telehealth (HOSPITAL_COMMUNITY): Payer: Self-pay | Admitting: Emergency Medicine

## 2013-03-18 NOTE — ED Notes (Signed)
Post ED Visit - Positive Culture Follow-up  Culture report reviewed by antimicrobial stewardship pharmacist: []  Wes Dulaney, Pharm.D., BCPS [x]  Celedonio Miyamoto, [address].D., BCPS []  Georgina Pillion, [address].D., BCPS []  [place], [place].D., BCPS, AAHIVP []  Estella Husk, Pharm.D., BCPS, AAHIVP  Positive urine culture Treated with Keflex, organism sensitive to the same and no further patient follow-up is required at this time.  Christopher Valencia 03/18/2013, 3:09 PM

## 2013-03-24 NOTE — ED Provider Notes (Signed)
Medical screening examination/treatment/procedure(s) were performed by non-physician practitioner and as supervising physician I was immediately available for consultation/collaboration.   Candyce Churn, MD 03/24/13 [phone]

## 2013-04-28 ENCOUNTER — Other Ambulatory Visit: Payer: Self-pay

## 2013-04-28 MED ORDER — TRAMADOL HCL 50 MG PO TABS: 50.0000 mg | ORAL_TABLET | Freq: Four times a day (QID) | ORAL | Status: DC | PRN

## 2013-04-28 NOTE — Telephone Encounter (Signed)
Called in Tramadol refill. Patient had refills remaining but pharmacy needed a new verbal when prescription changed drug classes.

## 2013-09-25 ENCOUNTER — Encounter: Payer: Self-pay | Admitting: Physical Medicine & Rehabilitation

## 2013-09-25 ENCOUNTER — Ambulatory Visit (HOSPITAL_BASED_OUTPATIENT_CLINIC_OR_DEPARTMENT_OTHER): Payer: Medicare Other | Admitting: Physical Medicine & Rehabilitation

## 2013-09-25 ENCOUNTER — Encounter: Payer: Medicare Other | Attending: Physical Medicine & Rehabilitation

## 2013-09-25 VITALS — BP 97/45 | HR 59 | Resp 14

## 2013-09-25 DIAGNOSIS — IMO0002 Reserved for concepts with insufficient information to code with codable children: Secondary | ICD-10-CM | POA: Diagnosis not present

## 2013-09-25 DIAGNOSIS — Y33XXXS Other specified events, undetermined intent, sequela: Secondary | ICD-10-CM | POA: Insufficient documentation

## 2013-09-25 DIAGNOSIS — G8250 Quadriplegia, unspecified: Secondary | ICD-10-CM | POA: Insufficient documentation

## 2013-09-25 DIAGNOSIS — S14106A Unspecified injury at C6 level of cervical spinal cord, initial encounter: Secondary | ICD-10-CM

## 2013-09-25 DIAGNOSIS — S14105A Unspecified injury at C5 level of cervical spinal cord, initial encounter: Secondary | ICD-10-CM

## 2013-09-25 MED ORDER — TRAMADOL HCL 50 MG PO TABS: 50.0000 mg | ORAL_TABLET | Freq: Four times a day (QID) | ORAL | Status: DC | PRN

## 2013-09-25 NOTE — Progress Notes (Signed)
   Subjective:    Patient ID: Christopher Valencia, male    DOB: 10/05/1975, 38 y.o.   MRN: [id number]  HPI he needs to take tramadol 3-6 tablets per day as needed for low back pain No longer takes baclofen had a muscle strain last year  Continues to see urologist for yearly kidney ultrasound Patient had a urinary tract infection last year Uses condom catheter  Has a bowel program with daily suppository  Some power wheelchair repairs but no other new equipment issues Pain Inventory Average Pain 3 Pain Right Now 5 My pain is constant, sharp and stabbing  In the last 24 hours, has pain interfered with the following? General activity 1 Relation with others 1 Enjoyment of life 2 What TIME of day is your pain at its worst? morning Sleep (in general) Good  Pain is worse with: sitting and inactivity Pain improves with: rest, therapy/exercise and medication Relief from Meds: 5  Mobility use a wheelchair needs help with transfers  Function disabled: date disabled . I need assistance with the following:  dressing, bathing, toileting, meal prep, household duties and shopping  Neuro/Psych bladder control problems bowel control problems weakness numbness tingling spasms  Prior Studies Any changes since last visit?  no  Physicians involved in your care Any changes since last visit?  no   History reviewed. No pertinent family history. History   Social History  . Marital Status: Single    Spouse Name: N/A    Number of Children: N/A  . Years of Education: N/A   Social History Main Topics  . Smoking status: Never Smoker   . Smokeless tobacco: Never Used  . Alcohol Use: No  . Drug Use: No  . Sexual Activity: None   Other Topics Concern  . None   Social History Narrative  . None   Past Surgical History  Procedure Laterality Date  . Abdominal surgery    . Back surgery     Past Medical History  Diagnosis Date  . Spinal cord injury, C5-C7   . Paraplegia following  spinal cord injury    BP 97/45  Pulse 59  Resp 14  SpO2 100%  Opioid Risk Score:   Fall Risk Score: Low Fall Risk (0-5 points)   Review of Systems  Gastrointestinal:       Bowel Control Problems  Genitourinary:       Bladder control problems  Musculoskeletal:       Spasms  Neurological: Positive for weakness and numbness.       Tingling  All other systems reviewed and are negative.      Objective:   Physical Exam  Motor strength is 5/5 bilateral deltoid bicep 0/5 in the tricep 4/5 in the wrist extensors, 0/5 in the fingers extensors and finger flexors and hand intrinsic muscles 0/5 in the lower extremities Sensation mild reduction in right C8, minimal reduction left C8, normal at C7 and above Decreased tone in bilateral lower extremities      Assessment & Plan:  1. C6 quadriplegia sensory in complete motor complete, doing well with tramadol for pain, independent with motorized wheelchair use, has a CNA for dressing and bathing Monday through Friday 8 hours per day, parents help at night and on the weekends  Prescription for tramadol written, discussed that tramadol now needs every 6 months provider face-to-face visit Over half of the 25 min visit was spent counseling and coordinating care. Return to clinic in 6 months

## 2013-09-25 NOTE — Patient Instructions (Signed)
Cont current bowel and bladder program

## 2014-07-27 ENCOUNTER — Telehealth: Payer: Self-pay | Admitting: *Deleted

## 2014-07-27 MED ORDER — TRAMADOL HCL 50 MG PO TABS: 50.0000 mg | ORAL_TABLET | Freq: Four times a day (QID) | ORAL | Status: DC | PRN

## 2014-07-27 NOTE — Telephone Encounter (Signed)
Refilled. Notified Melanee SpryIan.

## 2014-07-27 NOTE — Telephone Encounter (Signed)
Pt is asking for a refill on his tramadol, pt has a 1 year f/u 09/24/2014 with Dr. Wynn BankerKirsteins

## 2014-08-02 ENCOUNTER — Other Ambulatory Visit: Payer: Self-pay | Admitting: *Deleted

## 2014-08-02 MED ORDER — TRAMADOL HCL 50 MG PO TABS: 50.0000 mg | ORAL_TABLET | Freq: Four times a day (QID) | ORAL | Status: DC | PRN

## 2014-08-10 ENCOUNTER — Telehealth: Payer: Self-pay | Admitting: *Deleted

## 2014-08-10 MED ORDER — TRAMADOL HCL 50 MG PO TABS: 50.0000 mg | ORAL_TABLET | Freq: Four times a day (QID) | ORAL | Status: DC | PRN

## 2014-08-10 NOTE — Telephone Encounter (Signed)
Requesting a call back about a tramadol order given 08/02/14. Patient wasn't in their system so address information given and new verbal order placed for the tramadol 50 mg #120 1RF

## 2014-09-24 ENCOUNTER — Encounter: Payer: Medicare Other | Attending: Physical Medicine & Rehabilitation

## 2014-09-24 ENCOUNTER — Encounter: Payer: Self-pay | Admitting: Physical Medicine & Rehabilitation

## 2014-09-24 ENCOUNTER — Ambulatory Visit (HOSPITAL_BASED_OUTPATIENT_CLINIC_OR_DEPARTMENT_OTHER): Payer: Medicare Other | Admitting: Physical Medicine & Rehabilitation

## 2014-09-24 VITALS — BP 98/46 | HR 69 | Resp 14

## 2014-09-24 DIAGNOSIS — S14106D Unspecified injury at C6 level of cervical spinal cord, subsequent encounter: Secondary | ICD-10-CM | POA: Diagnosis present

## 2014-09-24 DIAGNOSIS — G8250 Quadriplegia, unspecified: Secondary | ICD-10-CM | POA: Insufficient documentation

## 2014-09-24 DIAGNOSIS — IMO0002 Reserved for concepts with insufficient information to code with codable children: Secondary | ICD-10-CM

## 2014-09-24 MED ORDER — TRAMADOL HCL 50 MG PO TABS: 50.0000 mg | ORAL_TABLET | Freq: Four times a day (QID) | ORAL | Status: DC | PRN
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
## 2015-03-22 ENCOUNTER — Ambulatory Visit: Payer: Medicare Other | Admitting: Physical Medicine & Rehabilitation

## 2015-03-26 ENCOUNTER — Ambulatory Visit: Payer: Medicare Other | Admitting: Physical Medicine & Rehabilitation

## 2015-03-26 ENCOUNTER — Ambulatory Visit: Payer: Medicare Other

## 2015-05-16 ENCOUNTER — Other Ambulatory Visit: Payer: Self-pay | Admitting: Physical Medicine & Rehabilitation

## 2015-05-23 ENCOUNTER — Other Ambulatory Visit: Payer: Self-pay | Admitting: Physical Medicine & Rehabilitation

## 2015-05-23 NOTE — Telephone Encounter (Signed)
Patient has not been seen since April, how many refills should we allow? Thanks.

## 2015-06-11 ENCOUNTER — Ambulatory Visit (HOSPITAL_BASED_OUTPATIENT_CLINIC_OR_DEPARTMENT_OTHER): Payer: Medicare Other | Admitting: Physical Medicine & Rehabilitation

## 2015-06-11 ENCOUNTER — Encounter: Payer: Self-pay | Admitting: Physical Medicine & Rehabilitation

## 2015-06-11 ENCOUNTER — Encounter: Payer: Medicare Other | Attending: Physical Medicine & Rehabilitation

## 2015-06-11 VITALS — BP 71/46 | HR 52 | Resp 14

## 2015-06-11 DIAGNOSIS — K592 Neurogenic bowel, not elsewhere classified: Secondary | ICD-10-CM | POA: Insufficient documentation

## 2015-06-11 DIAGNOSIS — S14106D Unspecified injury at C6 level of cervical spinal cord, subsequent encounter: Secondary | ICD-10-CM

## 2015-06-11 DIAGNOSIS — G8929 Other chronic pain: Secondary | ICD-10-CM | POA: Insufficient documentation

## 2015-06-11 DIAGNOSIS — S14106A Unspecified injury at C6 level of cervical spinal cord, initial encounter: Secondary | ICD-10-CM | POA: Diagnosis present

## 2015-06-11 DIAGNOSIS — N319 Neuromuscular dysfunction of bladder, unspecified: Secondary | ICD-10-CM | POA: Insufficient documentation

## 2015-06-11 DIAGNOSIS — G8250 Quadriplegia, unspecified: Secondary | ICD-10-CM | POA: Diagnosis present

## 2015-06-11 DIAGNOSIS — R269 Unspecified abnormalities of gait and mobility: Secondary | ICD-10-CM | POA: Diagnosis not present

## 2015-06-11 DIAGNOSIS — IMO0002 Reserved for concepts with insufficient information to code with codable children: Secondary | ICD-10-CM

## 2015-06-11 MED ORDER — TRAMADOL HCL 50 MG PO TABS: 50.0000 mg | ORAL_TABLET | Freq: Four times a day (QID) | ORAL | Status: DC | PRN

## 2015-06-11 NOTE — Progress Notes (Signed)
Subjective:    Patient ID: Christopher Valencia, male    DOB: 01/30/1976, 40 y.o.   MRN: [id number]  HPI   Cervical cord injury in 2000 Aleve has been substituted for the last week when Tramadol ran out  Control switch failed on his [employer] power chair  Pt urinates without catheter Bowel program with magic bullet suppository  Finished school in 2011 Looking into a masters program Pain Inventory Average Pain 3 Pain Right Now 4 My pain is constant, sharp, stabbing and tingling  In the last 24 hours, has pain interfered with the following? General activity 3 Relation with others 0 Enjoyment of life 3 What TIME of day is your pain at its worst? morning Sleep (in general) Fair  Pain is worse with: sitting and inactivity Pain improves with: rest, therapy/exercise and medication Relief from Meds: 8  Mobility ability to climb steps?  no do you drive?  no use a wheelchair needs help with transfers Do you have any goals in this area?  yes  Function disabled: date disabled . I need assistance with the following:  dressing, bathing, toileting, meal prep, household duties and shopping Do you have any goals in this area?  yes  Neuro/Psych bladder control problems bowel control problems weakness numbness tingling spasms  Prior Studies Any changes since last visit?  no  Physicians involved in your care Any changes since last visit?  no   History reviewed. No pertinent family history. Social History   Social History  . Marital Status: Single    Spouse Name: N/A  . Number of Children: N/A  . Years of Education: N/A   Social History Main Topics  . Smoking status: Never Smoker   . Smokeless tobacco: Never Used  . Alcohol Use: No  . Drug Use: No  . Sexual Activity: Not Asked   Other Topics Concern  . None   Social History Narrative   Past Surgical History  Procedure Laterality Date  . Abdominal surgery    . Back surgery     Past Medical History  Diagnosis  Date  . Spinal cord injury, C5-C7 (HCC)   . Paraplegia following spinal cord injury (HCC)    BP 71/46 mmHg  Pulse 52  Resp 14  SpO2 97%  Opioid Risk Score:   Fall Risk Score:  `1  Depression screen PHQ 2/9  No flowsheet data found.   Review of Systems  Gastrointestinal:       Neurogenic bowels  Genitourinary:       Neurogenic bladder  Musculoskeletal:       Spasms  Neurological: Positive for weakness and numbness.       Tingling  All other systems reviewed and are negative.      Objective:   Physical Exam  Constitutional: He is oriented to person, place, and time. He appears well-developed and well-nourished.  HENT:  Head: Normocephalic and atraumatic.  Eyes: Conjunctivae and EOM are normal. Pupils are equal, round, and reactive to light.  Neurological: He is alert and oriented to person, place, and time.  5/5 bilateral deltoid biceps and wrist extensors, trace triceps 0 finger extensor, 0 lower extremity  Sensation normal to C8 on the right Sensation normal to C6 on the left  Non ambulatory   Psychiatric: He has a normal mood and affect.  Nursing note and vitals reviewed.         Assessment & Plan:  1.  C6 ASIA B spinal cord injury with chronic tetraplegia Neurogenic  bowel and bladder, Managed as above  No significant spasticity  Chronic neck and shoulder pain, continue tramadol 50 mg 4 times a day, Physical medicine rehabilitation follow-up in 6 months  If patient needs extensive paperwork filled out for motorized wheelchair will need to see him back sooner

## 2015-06-11 NOTE — Patient Instructions (Signed)
If you need extensive paperwork filled out for a new wheelchair, you'll need to make another appointment.

## 2015-12-16 ENCOUNTER — Encounter: Payer: Medicare Other | Attending: Physical Medicine & Rehabilitation

## 2015-12-16 ENCOUNTER — Ambulatory Visit: Payer: Medicare Other | Admitting: Physical Medicine & Rehabilitation

## 2016-01-21 ENCOUNTER — Telehealth: Payer: Self-pay

## 2016-01-21 NOTE — Telephone Encounter (Signed)
Pt has an appointment on 01/30/16 with [facility]. His Tramadol will run out before then. Pt is asking for a bridge script. Please advise.

## 2016-01-22 MED ORDER — TRAMADOL HCL 50 MG PO TABS: 50.0000 mg | ORAL_TABLET | Freq: Four times a day (QID) | ORAL | 0 refills | Status: DC | PRN

## 2016-01-22 NOTE — Telephone Encounter (Signed)
Mr. Renard MatterMcInnis chart reviewed, Tramadol called in, no refills. He has an appointment with Dr. Wynn BankerKirsteins next week. Placed a call to Mr. Boruff no answer, left message to return the call.

## 2016-01-30 ENCOUNTER — Ambulatory Visit: Payer: Medicare Other | Admitting: Physical Medicine & Rehabilitation

## 2016-02-13 ENCOUNTER — Ambulatory Visit (HOSPITAL_BASED_OUTPATIENT_CLINIC_OR_DEPARTMENT_OTHER): Payer: Medicare Other | Admitting: Physical Medicine & Rehabilitation

## 2016-02-13 ENCOUNTER — Encounter: Payer: Self-pay | Admitting: Physical Medicine & Rehabilitation

## 2016-02-13 ENCOUNTER — Encounter: Payer: Medicare Other | Attending: Physical Medicine & Rehabilitation

## 2016-02-13 VITALS — BP 94/62 | HR 47 | Resp 17

## 2016-02-13 DIAGNOSIS — Z9889 Other specified postprocedural states: Secondary | ICD-10-CM | POA: Insufficient documentation

## 2016-02-13 DIAGNOSIS — M545 Low back pain: Secondary | ICD-10-CM | POA: Diagnosis not present

## 2016-02-13 DIAGNOSIS — S14106D Unspecified injury at C6 level of cervical spinal cord, subsequent encounter: Secondary | ICD-10-CM

## 2016-02-13 DIAGNOSIS — K592 Neurogenic bowel, not elsewhere classified: Secondary | ICD-10-CM

## 2016-02-13 DIAGNOSIS — IMO0002 Reserved for concepts with insufficient information to code with codable children: Secondary | ICD-10-CM

## 2016-02-13 DIAGNOSIS — G8250 Quadriplegia, unspecified: Secondary | ICD-10-CM

## 2016-02-13 DIAGNOSIS — G8220 Paraplegia, unspecified: Secondary | ICD-10-CM | POA: Diagnosis not present

## 2016-02-13 DIAGNOSIS — G8929 Other chronic pain: Secondary | ICD-10-CM | POA: Insufficient documentation

## 2016-02-13 DIAGNOSIS — N319 Neuromuscular dysfunction of bladder, unspecified: Secondary | ICD-10-CM | POA: Diagnosis not present

## 2016-02-13 DIAGNOSIS — X58XXXS Exposure to other specified factors, sequela: Secondary | ICD-10-CM | POA: Diagnosis not present

## 2016-02-13 DIAGNOSIS — S14159S Other incomplete lesion at unspecified level of cervical spinal cord, sequela: Secondary | ICD-10-CM | POA: Insufficient documentation

## 2016-02-13 MED ORDER — TRAMADOL HCL 50 MG PO TABS: 50.0000 mg | ORAL_TABLET | Freq: Four times a day (QID) | ORAL | 5 refills | Status: DC | PRN

## 2016-02-13 NOTE — Progress Notes (Signed)
Subjective:    Patient ID: Christopher Valencia, male    DOB: 02/03/1976, 40 y.o.   MRN: [id number]  HPI  40 year old male with history of C6 incomplete spinal cord injury, injury date 2000, C3-C6 fusion. Dr. Maryan RuedHirsh  Continues to use suppository for bowel movement No catheterization needed for bladder. Denies any bladder infection since starting on cranberry extract supplements  Has new power chair Looking into new WC van Pain Inventory Average Pain 3 Pain Right Now 1 My pain is constant, stabbing and aching  In the last 24 hours, has pain interfered with the following? General activity 1 Relation with others 0 Enjoyment of life 0 What TIME of day is your pain at its worst? daytime, evening Sleep (in general) Fair  Pain is worse with: sitting and inactivity Pain improves with: therapy/exercise and medication Relief from Meds: 5  Mobility ability to climb steps?  no do you drive?  no use a wheelchair needs help with transfers Do you have any goals in this area?  yes  Function disabled: date disabled NA I need assistance with the following:  dressing, bathing, toileting, meal prep and household duties Do you have any goals in this area?  yes  Neuro/Psych bladder control problems bowel control problems weakness numbness tingling spasms  Prior Studies Any changes since last visit?  no  Physicians involved in your care Any changes since last visit?  no   History reviewed. No pertinent family history. Social History   Social History  . Marital status: Single    Spouse name: N/A  . Number of children: N/A  . Years of education: N/A   Social History Main Topics  . Smoking status: Never Smoker  . Smokeless tobacco: Never Used  . Alcohol use No  . Drug use: No  . Sexual activity: Not Asked   Other Topics Concern  . None   Social History Narrative  . None   Past Surgical History:  Procedure Laterality Date  . ABDOMINAL SURGERY    . BACK SURGERY      Past Medical History:  Diagnosis Date  . Paraplegia following spinal cord injury (HCC)   . Spinal cord injury, C5-C7 (HCC)    BP 94/62   Pulse (!) 47   Resp 17   SpO2 96%   Opioid Risk Score:   Fall Risk Score:  `1  Depression screen PHQ 2/9  No flowsheet data found.  Review of Systems  Constitutional:       Bladder control problems Bowel control problems   Neurological: Positive for weakness and numbness.       Tingling Spasms   All other systems reviewed and are negative.      Objective:   Physical Exam  Constitutional: He is oriented to person, place, and time. He appears well-developed and well-nourished.  HENT:  Head: Normocephalic and atraumatic.  Eyes: Conjunctivae and EOM are normal. Pupils are equal, round, and reactive to light.  Neurological: He is alert and oriented to person, place, and time.  Psychiatric: He has a normal mood and affect.  Nursing note and vitals reviewed.  5/5 bilateral deltoid, bicep and wrist extension, 0 at the triceps, 0 at the finger flexors and finger extensors 0/5 in bilateral lower limbs. Patient has normal sensation to C8 and partial sensation to T 8.  He does report some patchy lower extremity preservation of sensation nondermatomal Gen. no acute distress      Assessment & Plan:  1.  C6 ASIA  B SCI with neurogenic bowel and bladder Bladder is continent If patient has not had ultrasound of the kidneys. He should do this on a yearly basis  2. Chronic low back pain. Continue tramadol 50mg  4 times a day Return to physical medicine rehabilitation patient clinic 6 months

## 2016-08-13 ENCOUNTER — Ambulatory Visit (HOSPITAL_BASED_OUTPATIENT_CLINIC_OR_DEPARTMENT_OTHER): Payer: Medicare Other | Admitting: Physical Medicine & Rehabilitation

## 2016-08-13 ENCOUNTER — Encounter: Payer: Medicare Other | Attending: Physical Medicine & Rehabilitation

## 2016-08-13 ENCOUNTER — Encounter: Payer: Self-pay | Admitting: Physical Medicine & Rehabilitation

## 2016-08-13 VITALS — BP 87/56 | HR 51 | Resp 14

## 2016-08-13 DIAGNOSIS — K592 Neurogenic bowel, not elsewhere classified: Secondary | ICD-10-CM | POA: Diagnosis not present

## 2016-08-13 DIAGNOSIS — N319 Neuromuscular dysfunction of bladder, unspecified: Secondary | ICD-10-CM | POA: Diagnosis not present

## 2016-08-13 DIAGNOSIS — X58XXXD Exposure to other specified factors, subsequent encounter: Secondary | ICD-10-CM | POA: Insufficient documentation

## 2016-08-13 DIAGNOSIS — K529 Noninfective gastroenteritis and colitis, unspecified: Secondary | ICD-10-CM | POA: Diagnosis not present

## 2016-08-13 DIAGNOSIS — S14106D Unspecified injury at C6 level of cervical spinal cord, subsequent encounter: Secondary | ICD-10-CM | POA: Diagnosis not present

## 2016-08-13 MED ORDER — TRAMADOL HCL 50 MG PO TABS: 50.0000 mg | ORAL_TABLET | Freq: Four times a day (QID) | ORAL | 5 refills | Status: DC | PRN

## 2016-08-13 NOTE — Patient Instructions (Signed)
Please let me know if there are any equipment needs next visit

## 2016-08-13 NOTE — Progress Notes (Signed)
   Subjective:    Patient ID: Christopher Valencia, male    DOB: 07/26/1975, 41 y.o.   MRN: [id number]  HPI   Pain Inventory Average Pain 3 Pain Right Now 3 My pain is constant, sharp, stabbing, tingling and aching  In the last 24 hours, has pain interfered with the following? General activity 4 Relation with others 0 Enjoyment of life 4 What TIME of day is your pain at its worst? all Sleep (in general) Good  Pain is worse with: sitting and standing Pain improves with: therapy/exercise and medication Relief from Meds: 4  Mobility ability to climb steps?  no do you drive?  no use a wheelchair needs help with transfers  Function disabled: date disabled . I need assistance with the following:  dressing, bathing, toileting, meal prep, household duties and shopping  Neuro/Psych bladder control problems bowel control problems weakness numbness tingling spasms  Prior Studies Any changes since last visit?  no  Physicians involved in your care Any changes since last visit?  no   History reviewed. No pertinent family history. Social History   Social History  . Marital status: Single    Spouse name: N/A  . Number of children: N/A  . Years of education: N/A   Social History Main Topics  . Smoking status: Never Smoker  . Smokeless tobacco: Never Used  . Alcohol use No  . Drug use: No  . Sexual activity: Not Asked   Other Topics Concern  . None   Social History Narrative  . None   Past Surgical History:  Procedure Laterality Date  . ABDOMINAL SURGERY    . BACK SURGERY     Past Medical History:  Diagnosis Date  . Paraplegia following spinal cord injury (HCC)   . Spinal cord injury, C5-C7 (HCC)    BP (!) 87/56   Pulse (!) 51   Resp 14   SpO2 98%   Opioid Risk Score:   Fall Risk Score:  `1  Depression screen PHQ 2/9  No flowsheet data found.  Review of Systems  Constitutional: Negative.   HENT: Negative.   Eyes: Negative.   Respiratory: Negative.    Cardiovascular: Negative.   Gastrointestinal: Negative.   Endocrine: Negative.   Genitourinary: Negative.   Musculoskeletal: Negative.   Skin: Negative.   Allergic/Immunologic: Negative.   Neurological: Negative.   Hematological: Negative.   Psychiatric/Behavioral: Negative.   All other systems reviewed and are negative.      Objective:   Physical Exam Dimished PP and LT left vs R Motor bilateral C6 level Delt 5/5 Bi 5-/5 Tri 0 Finger flexors 0 Wrist ext 4- RIght, 3- Left   0/5 bilateral LE       Assessment & Plan:

## 2017-02-15 ENCOUNTER — Encounter: Payer: Self-pay | Admitting: Physical Medicine & Rehabilitation

## 2017-02-15 ENCOUNTER — Ambulatory Visit (HOSPITAL_BASED_OUTPATIENT_CLINIC_OR_DEPARTMENT_OTHER): Payer: Medicare Other | Admitting: Physical Medicine & Rehabilitation

## 2017-02-15 ENCOUNTER — Encounter: Payer: Medicare Other | Attending: Physical Medicine & Rehabilitation

## 2017-02-15 VITALS — BP 102/67 | HR 56 | Resp 14

## 2017-02-15 DIAGNOSIS — X58XXXS Exposure to other specified factors, sequela: Secondary | ICD-10-CM | POA: Diagnosis not present

## 2017-02-15 DIAGNOSIS — S14106D Unspecified injury at C6 level of cervical spinal cord, subsequent encounter: Secondary | ICD-10-CM | POA: Diagnosis not present

## 2017-02-15 DIAGNOSIS — K592 Neurogenic bowel, not elsewhere classified: Secondary | ICD-10-CM | POA: Diagnosis not present

## 2017-02-15 DIAGNOSIS — S14106S Unspecified injury at C6 level of cervical spinal cord, sequela: Secondary | ICD-10-CM | POA: Insufficient documentation

## 2017-02-15 DIAGNOSIS — G8220 Paraplegia, unspecified: Secondary | ICD-10-CM | POA: Insufficient documentation

## 2017-02-15 DIAGNOSIS — Z981 Arthrodesis status: Secondary | ICD-10-CM | POA: Diagnosis not present

## 2017-02-15 DIAGNOSIS — M549 Dorsalgia, unspecified: Secondary | ICD-10-CM | POA: Insufficient documentation

## 2017-02-15 DIAGNOSIS — N319 Neuromuscular dysfunction of bladder, unspecified: Secondary | ICD-10-CM | POA: Insufficient documentation

## 2017-02-15 MED ORDER — TRAMADOL HCL 50 MG PO TABS: 50.0000 mg | ORAL_TABLET | Freq: Four times a day (QID) | ORAL | 5 refills | Status: DC | PRN

## 2017-02-15 NOTE — Progress Notes (Signed)
Subjective:    Patient ID: Christopher Valencia, male    DOB: 1976-04-24, 41 y.o.   MRN: [id number] 41 year old male with history of C6 incomplete spinal cord injury, injury date 2000, C3-C6 fusion. Dr. 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 General activity 2 Relation with others 2 Enjoyment of life 2 What TIME of day is your pain at its worst? varies Sleep (in general) Good  Pain is worse with: inactivity and some activites Pain improves with: therapy/exercise and medication Relief from Meds: 8  Mobility use a wheelchair needs help with transfers  Function disabled: date disabled .  Neuro/Psych bladder control problems bowel control problems weakness numbness tremor tingling spasms  Prior Studies Any changes since last visit?  no  Physicians involved in your care Any changes since last visit?  no   History reviewed. No pertinent family history. Social History   Social History  . Marital status: Single    Spouse name: N/A  . Number of children: N/A  . Years of education: N/A   Social History Main Topics  . Smoking status: Never Smoker  . Smokeless tobacco: Never Used  . Alcohol use No  . Drug use: No  . Sexual activity: Not Asked   Other Topics Concern  . None   Social History Narrative  . None   Past Surgical History:  Procedure Laterality Date  . ABDOMINAL SURGERY    . BACK SURGERY     Past Medical History:  Diagnosis Date  . Paraplegia following spinal cord injury (HCC)   . Spinal cord injury, C5-C7 (HCC)    BP 102/67   Pulse (!) 56   Resp 14   SpO2 96%   Opioid Risk Score:   Fall Risk Score:  `1  Depression screen PHQ 2/9  No flowsheet data found.  Review  of Systems  HENT: Negative.   Eyes: Negative.   Respiratory: Negative.   Cardiovascular: Negative.   Gastrointestinal: Negative.        Neurogenic bowel  Endocrine: Negative.   Genitourinary:       Neurogenic bladder  Musculoskeletal: Positive for back pain.       Spasms  Skin: Negative.   Allergic/Immunologic: Negative.   Neurological: Positive for tremors, weakness and numbness.       Tingling  Hematological: Negative.   Psychiatric/Behavioral: Negative.   All other systems reviewed and are negative.      Objective:   Physical Exam  Constitutional: He is oriented to person, place, and time. He appears well-developed and well-nourished.  HENT:  Head: Normocephalic and atraumatic.  Eyes: Pupils are equal, round, and reactive to light.  Neurological: He is alert and oriented to person, place, and time.  Psychiatric: He has a normal mood and affect.  Nursing note and vitals reviewed.  Left UE LT C6 and above R UE and R LE LT intact  5/5 B Delt , Biceps 4/ 5 R WE 3/5  Left WE  Trace R tri, 0 Left tri       Assessment & Plan:  1.  C6 incomplete with neurogenic bowel Motorized WC Has aide to help with ADLs Aide also drives his WC van  Continued  tramadol for chronic neck and upper extremity pain. 50 mg 4 times a day No sign of misuse.

## 2017-02-15 NOTE — Patient Instructions (Signed)
Acetaminophen; Tramadol tablets What is this medicine? ACETAMINOPHEN; TRAMADOL (a set a MEE noe fen; TRA ma dole) is a pain reliever. It is used to treat short term moderate pain. This medicine may be used for other purposes; ask your health care provider or pharmacist if you have questions. COMMON BRAND NAME(S): Ultracet What should I tell my health care provider before I take this medicine? They need to know if you have any of these conditions: -brain tumor -depression -drug abuse or addiction -head injury -if you often drink alcohol -kidney disease or trouble passing urine -liver disease -lung disease, asthma, or breathing problems -seizures or epilepsy -suicidal thoughts, plans, or attempt; a previous suicide attempt by you or a family member -an unusual or allergic reaction to acetaminophen, tramadol, codeine, other opioid analgesics, other medicines, foods, dyes, or preservatives -pregnant or trying to get pregnant -breast-feeding How should I use this medicine? Take this medicine by mouth with a full glass of water. Follow the directions on the prescription label. If the medicine upsets your stomach, take it with food or milk. Do not take your medicine more often than directed. A special MedGuide will be given to you by the pharmacist with each prescription and refill. Be sure to read this information carefully each time. Talk to your pediatrician regarding the use of this medicine in children. Special care may be needed. Overdosage: If you think you have taken too much of this medicine contact a poison control center or emergency room at once. NOTE: This medicine is only for you. Do not share this medicine with others. What if I miss a dose? If you miss a dose, take it as soon as you can. If it is almost time for your next dose, take only that dose. Do not take double or extra doses. What may interact with this medicine? Do not take this medication with any of the following  medicines: -MAOIs like Carbex, Eldepryl, Marplan, Nardil, and Parnate This medicine may also interact with the following medications: -alcohol -antihistamines for allergy, cough and cold -certain medicines for anxiety or sleep -certain medicines for depression like amitriptyline, fluoxetine, sertraline -certain medicines for migraine headache like almotriptan, eletriptan, frovatriptan, naratriptan, rizatriptan, sumatriptan, zolmitriptan -certain medicines for seizures like carbamazepine, oxcarbazepine, phenobarbital, primidone -certain medicines that treat or prevent blood clots like warfarin -digoxin -furazolidone -general anesthetics like halothane, isoflurane, methoxyflurane, propofol -linezolid -local anesthetics like lidocaine, pramoxine, tetracaine -medicines that relax muscles for surgery -other narcotic medicines for pain or cough -phenothiazines like chlorpromazine, mesoridazine, prochlorperazine, thioridazine -procarbazine This list may not describe all possible interactions. Give your health care provider a list of all the medicines, herbs, non-prescription drugs, or dietary supplements you use. Also tell them if you smoke, drink alcohol, or use illegal drugs. Some items may interact with your medicine. What should I watch for while using this medicine? Tell your doctor or health care professional if your pain does not go away, if it gets worse, or if you have new or a different type of pain. You may develop tolerance to the medicine. Tolerance means that you will need a higher dose of the medicine for pain relief. Tolerance is normal and is expected if you take the medicine for a long time. Do not suddenly stop taking your medicine because you may develop a severe reaction. Your body becomes used to the medicine. This does NOT mean you are addicted. Addiction is a behavior related to getting and using a drug for a non-medical reason. If you   have pain, you have a medical reason to  take pain medicine. Your doctor will tell you how much medicine to take. If your doctor wants you to stop the medicine, the dose will be slowly lowered over time to avoid any side effects. There are different types of narcotic medicines (opiates). If you take more than one type at the same time or if you are taking another medicine that also causes drowsiness, you may have more side effects. Give your health care provider a list of all medicines you use. Your doctor will tell you how much medicine to take. Do not take more medicine than directed. Call emergency for help if you have problems breathing or unusual sleepiness. Do not take other medicines that contain acetaminophen with this medicine. Always read labels carefully. If you have questions, ask your doctor or pharmacist. If you take too much acetaminophen get medical help right away. Too much acetaminophen can be very dangerous and cause liver damage. Even if you do not have symptoms, it is important to get help right away. You may get drowsy or dizzy. Do not drive, use machinery, or do anything that needs mental alertness until you know how this medicine affects you. Do not stand or sit up quickly, especially if you are an older patient. This reduces the risk of dizzy or fainting spells. Alcohol may interfere with the effect of this medicine. Avoid alcoholic drinks. This medicine will cause constipation. Try to have a bowel movement at least every 2 to 3 days. If you do not have a bowel movement for 3 days, call your doctor or health care professional. Your mouth may get dry. Chewing sugarless gum or sucking hard candy, and drinking plenty of water may help. Contact your doctor if the problem does not go away or is severe. What side effects may I notice from receiving this medicine? Side effects that you should report to your doctor or health care professional as soon as possible: -allergic reactions like skin rash, itching or hives, swelling of  the face, lips, or tongue -breathing problems -confusion -redness, blistering, peeling or loosening of the skin, including inside the mouth -seizures -signs and symptoms of low blood pressure like dizziness; feeling faint or lightheaded, falls; unusually weak or tired -trouble passing urine or change in the amount of urine -yellowing of the eyes or skin Side effects that usually do not require medical attention (report to your doctor or health care professional if they continue or are bothersome): -constipation -dry mouth -nausea, vomiting -tiredness This list may not describe all possible side effects. Call your doctor for medical advice about side effects. You may report side effects to FDA at 1-800-FDA-1088. Where should I keep my medicine? Keep out of the reach of children. Tramadol is a morphine-like drug that can be abused. Keep your medicine in a safe place to protect it from theft. Do not share this medicine with anyone. Selling or giving away this medicine is dangerous and is against the law. This medicine may cause accidental overdose and death if it taken by other adults, children, or pets. Mix any unused medicine with a substance like cat litter or coffee grounds. Then throw the medicine away in a sealed container like a sealed bag or a coffee can with a lid. Do not use the medicine after the expiration date. Store at room temperature between 15 and 30 degrees C (59 and 86 degrees F). NOTE: This sheet is a summary. It may not cover all possible   information. If you have questions about this medicine, talk to your doctor, pharmacist, or health care provider.  2018 Elsevier/Gold Standard (2015-02-16 10:58:16)  

## 2017-08-16 ENCOUNTER — Ambulatory Visit: Payer: Medicare Other | Admitting: Physical Medicine & Rehabilitation

## 2017-08-16 ENCOUNTER — Encounter: Payer: Medicare Other | Attending: Physical Medicine & Rehabilitation

## 2017-09-09 ENCOUNTER — Telehealth: Payer: Self-pay | Admitting: Physical Medicine & Rehabilitation

## 2017-09-09 MED ORDER — TRAMADOL HCL 50 MG PO TABS: 50.0000 mg | ORAL_TABLET | Freq: Four times a day (QID) | ORAL | 0 refills | Status: DC | PRN

## 2017-09-09 NOTE — Telephone Encounter (Signed)
May Call in 1 wk supply of tramadol at current dose

## 2017-09-09 NOTE — Telephone Encounter (Signed)
Patient didn't show up for his March appointment.  He has rescheduled for next Thursday April 11 with Dr. Wynn BankerKirsteins.  He has run out of his Tramadol.  Patient would like to know if he can get a few pills to last him until his appointment next week.  Please call patient.

## 2017-09-09 NOTE — Telephone Encounter (Signed)
[facility]

## 2017-09-16 ENCOUNTER — Encounter: Payer: Medicare Other | Attending: Physical Medicine & Rehabilitation

## 2017-09-16 ENCOUNTER — Ambulatory Visit (HOSPITAL_BASED_OUTPATIENT_CLINIC_OR_DEPARTMENT_OTHER): Payer: Medicare Other | Admitting: Physical Medicine & Rehabilitation

## 2017-09-16 VITALS — BP 101/67 | HR 55 | Resp 14 | Ht 70.0 in | Wt 185.0 lb

## 2017-09-16 DIAGNOSIS — N319 Neuromuscular dysfunction of bladder, unspecified: Secondary | ICD-10-CM | POA: Insufficient documentation

## 2017-09-16 DIAGNOSIS — G8250 Quadriplegia, unspecified: Secondary | ICD-10-CM

## 2017-09-16 DIAGNOSIS — Z5181 Encounter for therapeutic drug level monitoring: Secondary | ICD-10-CM | POA: Insufficient documentation

## 2017-09-16 DIAGNOSIS — Z9889 Other specified postprocedural states: Secondary | ICD-10-CM | POA: Diagnosis not present

## 2017-09-16 DIAGNOSIS — K592 Neurogenic bowel, not elsewhere classified: Secondary | ICD-10-CM | POA: Diagnosis not present

## 2017-09-16 DIAGNOSIS — S14106S Unspecified injury at C6 level of cervical spinal cord, sequela: Secondary | ICD-10-CM | POA: Insufficient documentation

## 2017-09-16 DIAGNOSIS — Z79899 Other long term (current) drug therapy: Secondary | ICD-10-CM | POA: Insufficient documentation

## 2017-09-16 DIAGNOSIS — X58XXXS Exposure to other specified factors, sequela: Secondary | ICD-10-CM | POA: Insufficient documentation

## 2017-09-16 MED ORDER — TRAMADOL HCL 50 MG PO TABS: 50.0000 mg | ORAL_TABLET | Freq: Four times a day (QID) | ORAL | 5 refills | Status: DC | PRN

## 2017-09-16 NOTE — Progress Notes (Signed)
Subjective:    Patient ID: Christopher Valencia, male    DOB: [birth year], 42 y.o.   MRN: [id number] 42 year old male with history of C6 incomplete spinal cord injury, injury date 2000, C3-C6 fusion. Dr. Blanche East HPI  No new issues, some low back pain.  Sleeps in a hospital bed. Motorized WC functioning well No need for ICP Bowel program is suppository  Takes about 4 tramadol tablets per day Pain Inventory Average Pain 3 Pain Right Now 4 My pain is intermittent, constant, sharp, burning, dull, stabbing, tingling and aching  In the last 24 hours, has pain interfered with the following? General activity 2 Relation with others 0 Enjoyment of life 1 What TIME of day is your pain at its worst? morning, daytime  Sleep (in general) Fair  Pain is worse with: sitting and inactivity Pain improves with: rest, therapy/exercise and medication Relief from Meds: 5  Mobility ability to climb steps?  no do you drive?  no use a wheelchair needs help with transfers Do you have any goals in this area?  yes  Function disabled: date disabled . I need assistance with the following:  dressing, bathing, toileting, meal prep and household duties Do you have any goals in this area?  yes  Neuro/Psych bladder control problems bowel control problems weakness numbness tremor tingling spasms  Prior Studies Any changes since last visit?  no  Physicians involved in your care Any changes since last visit?  no   No family history on file. Social History   Socioeconomic History  . Marital status: Single    Spouse name: Not on file  . Number of children: Not on file  . Years of education: Not on file  . Highest education level: Not on file  Occupational History  . Not on file  Social Needs  . Financial resource strain: Not on file  . Food insecurity:    Worry: Not on file    Inability: Not on file  . Transportation needs:    Medical: Not on file    Non-medical: Not on file  Tobacco Use    . Smoking status: Never Smoker  . Smokeless tobacco: Never Used  Substance and Sexual Activity  . Alcohol use: No  . Drug use: No  . Sexual activity: Not on file  Lifestyle  . Physical activity:    Days per week: Not on file    Minutes per session: Not on file  . Stress: Not on file  Relationships  . Social connections:    Talks on phone: Not on file    Gets together: Not on file    Attends religious service: Not on file    Active member of club or organization: Not on file    Attends meetings of clubs or organizations: Not on file    Relationship status: Not on file  Other Topics Concern  . Not on file  Social History Narrative  . Not on file   Past Surgical History:  Procedure Laterality Date  . ABDOMINAL SURGERY    . BACK SURGERY     Past Medical History:  Diagnosis Date  . Paraplegia following spinal cord injury (HCC)   . Spinal cord injury, C5-C7 (HCC)    BP 101/67 (BP Location: Right Wrist, Patient Position: Sitting, Cuff Size: Normal)   Pulse (!) 55   Resp 14   Ht 5\' 10"  (1.778 m)   Wt 185 lb (83.9 kg)   SpO2 97%   BMI 26.54 kg/m  Opioid Risk Score:   Fall Risk Score:  `1  Depression screen PHQ 2/9  No flowsheet data found.  Review of Systems  Constitutional: Negative.   HENT: Negative.   Eyes: Negative.   Respiratory: Negative.   Cardiovascular: Negative.   Gastrointestinal: Negative.  Negative for vomiting.       Neurogenic  Endocrine: Negative.   Genitourinary: Negative.        Neurogenic  Musculoskeletal: Positive for back pain.       Spasms   Skin: Negative.   Allergic/Immunologic: Negative.   Neurological: Positive for tremors, weakness and numbness.       Tingling  Hematological: Negative.   Psychiatric/Behavioral: Negative.   All other systems reviewed and are negative.      Objective:   Physical Exam  Constitutional: He appears well-developed and well-nourished.  HENT:  Head: Normocephalic and atraumatic.  Eyes: Pupils  are equal, round, and reactive to light. Conjunctivae and EOM are normal.  Neck: Normal range of motion. Neck supple.  Psychiatric: He has a normal mood and affect. His behavior is normal. Thought content normal.  Nursing note and vitals reviewed. Manual muscle testing: 5/5 bilateral deltoid bicep 4 at the right wrist extensor 3 at the left wrist extensor 0 at the finger flexors 0 at the finger extensors 0 at the thumb flexor and extensor Lower extremity strength is 0/5 bilateral lower limbs Sensation absent in the left C8 dermatomal distribution intact at the right C8 and partially impaired light touch right C7        Assessment & Plan:  1.  C6 [place] B tetraplegia. Patient stable with neurogenic bowel and bladder management. 2.  Chronic pain related to spinal cord injury continue tramadol 50 mill grams 4 times daily Indication for chronic opioid: See above Medication and dose: Tramadol 50 mg 4 times daily # pills per month: 120 Last UDS date: Oral swab performed secondary to neurogenic bladder.  09/16/2017 Opioid Treatment Agreement signed (Y/N): Yes 09/16/2017 Opioid Treatment Agreement last reviewed with patient:  09/16/2017 [facility] reviewed this encounter (include red flags):  09/16/2017

## 2017-09-21 LAB — DRUG TOX MONITOR 1 W/CONF, ORAL FLD
Amphetamines: NEGATIVE ng/mL (ref <10)
BARBITURATES: NEGATIVE ng/mL (ref <10)
Benzodiazepines: NEGATIVE ng/mL (ref <0.50)
Buprenorphine: NEGATIVE ng/mL (ref <0.10)
COCAINE: NEGATIVE ng/mL (ref <5.0)
FENTANYL: NEGATIVE ng/mL (ref <0.10)
Heroin Metabolite: NEGATIVE ng/mL (ref <1.0)
MARIJUANA: NEGATIVE ng/mL (ref <2.5)
MDMA: NEGATIVE ng/mL (ref <10)
METHADONE: NEGATIVE ng/mL (ref <5.0)
Meprobamate: NEGATIVE ng/mL (ref <2.5)
NICOTINE METABOLITE: NEGATIVE ng/mL (ref <5.0)
OPIATES: NEGATIVE ng/mL (ref <2.5)
PHENCYCLIDINE: NEGATIVE ng/mL (ref <10)
TAPENTADOL: NEGATIVE ng/mL (ref <5.0)
Tramadol: >500 ng/mL — ABNORMAL HIGH (ref <5.0)
Tramadol: POSITIVE ng/mL — AB (ref <5.0)
Zolpidem: NEGATIVE ng/mL (ref <5.0)

## 2017-09-21 LAB — DRUG TOX ALC METAB W/CON, ORAL FLD: Alcohol Metabolite: NEGATIVE ng/mL (ref <25)

## 2018-03-17 ENCOUNTER — Ambulatory Visit: Payer: Medicare Other | Admitting: Physical Medicine & Rehabilitation

## 2018-03-18 ENCOUNTER — Ambulatory Visit: Payer: Medicare Other | Admitting: Physical Medicine & Rehabilitation

## 2018-04-04 ENCOUNTER — Ambulatory Visit (HOSPITAL_BASED_OUTPATIENT_CLINIC_OR_DEPARTMENT_OTHER): Payer: Medicare Other | Admitting: Physical Medicine & Rehabilitation

## 2018-04-04 ENCOUNTER — Encounter: Payer: Self-pay | Admitting: Physical Medicine & Rehabilitation

## 2018-04-04 ENCOUNTER — Encounter: Payer: Medicare Other | Attending: Physical Medicine & Rehabilitation

## 2018-04-04 VITALS — BP 96/63 | HR 58 | Ht 70.0 in | Wt 190.0 lb

## 2018-04-04 DIAGNOSIS — G8254 Quadriplegia, C5-C7 incomplete: Secondary | ICD-10-CM | POA: Insufficient documentation

## 2018-04-04 DIAGNOSIS — G8250 Quadriplegia, unspecified: Secondary | ICD-10-CM | POA: Diagnosis not present

## 2018-04-04 DIAGNOSIS — G8929 Other chronic pain: Secondary | ICD-10-CM | POA: Insufficient documentation

## 2018-04-04 DIAGNOSIS — K592 Neurogenic bowel, not elsewhere classified: Secondary | ICD-10-CM

## 2018-04-04 DIAGNOSIS — M545 Low back pain: Secondary | ICD-10-CM | POA: Diagnosis not present

## 2018-04-04 MED ORDER — TRAMADOL HCL 50 MG PO TABS: 50.0000 mg | ORAL_TABLET | Freq: Four times a day (QID) | ORAL | 5 refills | Status: DC | PRN

## 2018-04-04 NOTE — Progress Notes (Signed)
Subjective:    Patient ID: Christopher Valencia, male    DOB: 1975/12/07, 42 y.o.   MRN: [id number] 42 year old male with history of C6 incomplete spinal cord injury, injury date 2000, C3-C6 fusion. Dr. Blanche East HPI Uses suppository for bowel program He does not require intermittent catheterization or other management for his neurogenic bladder other than fluid schedule He is electric wheelchair is in good state of repair Continues to require assistance for ADLs and transfers Spasticity has not been a big issue Low back pain is well controlled with tramadol No side effects from tramadol Pain Inventory Average Pain 3 Pain Right Now 2 My pain is sharp, burning, stabbing and tingling  In the last 24 hours, has pain interfered with the following? General activity 1 Relation with others 0 Enjoyment of life 0 What TIME of day is your pain at its worst? evening Sleep (in general) Good  Pain is worse with: sitting Pain improves with: rest, therapy/exercise and pacing activities Relief from Meds: na  Mobility ability to climb steps?  no do you drive?  no use a wheelchair  Function disabled: date disabled . I need assistance with the following:  feeding, dressing, bathing, toileting and meal prep  Neuro/Psych bladder control problems bowel control problems weakness numbness tingling spasms  Prior Studies Any changes since last visit?  no  Physicians involved in your care Any changes since last visit?  no   No family history on file. Social History   Socioeconomic History  . Marital status: Single    Spouse name: Not on file  . Number of children: Not on file  . Years of education: Not on file  . Highest education level: Not on file  Occupational History  . Not on file  Social Needs  . Financial resource strain: Not on file  . Food insecurity:    Worry: Not on file    Inability: Not on file  . Transportation needs:    Medical: Not on file    Non-medical: Not on file    Tobacco Use  . Smoking status: Never Smoker  . Smokeless tobacco: Never Used  Substance and Sexual Activity  . Alcohol use: No  . Drug use: No  . Sexual activity: Not on file  Lifestyle  . Physical activity:    Days per week: Not on file    Minutes per session: Not on file  . Stress: Not on file  Relationships  . Social connections:    Talks on phone: Not on file    Gets together: Not on file    Attends religious service: Not on file    Active member of club or organization: Not on file    Attends meetings of clubs or organizations: Not on file    Relationship status: Not on file  Other Topics Concern  . Not on file  Social History Narrative  . Not on file   Past Surgical History:  Procedure Laterality Date  . ABDOMINAL SURGERY    . BACK SURGERY     Past Medical History:  Diagnosis Date  . Paraplegia following spinal cord injury (HCC)   . Spinal cord injury, C5-C7 (HCC)    BP 96/63   Pulse (!) 58   Ht 5\' 10"  (1.778 m)   Wt 190 lb (86.2 kg)   SpO2 97%   BMI 27.26 kg/m   Opioid Risk Score:   Fall Risk Score:  `1  Depression screen PHQ 2/9  No flowsheet data found.  Review of Systems  Constitutional: Negative.   HENT: Negative.   Eyes: Negative.   Respiratory: Negative.   Cardiovascular: Negative.   Gastrointestinal: Negative.   Endocrine: Negative.   Genitourinary: Negative.   Musculoskeletal: Positive for arthralgias, gait problem and myalgias.  Skin: Negative.   Allergic/Immunologic: Negative.   Neurological: Positive for weakness and numbness.  Hematological: Negative.   Psychiatric/Behavioral: Negative.   All other systems reviewed and are negative.      Objective:   Physical Exam  Constitutional: He is oriented to person, place, and time. He appears well-developed and well-nourished. No distress.  HENT:  Head: Normocephalic and atraumatic.  Eyes: Pupils are equal, round, and reactive to light.  Pulmonary/Chest: Effort normal. No  stridor. No respiratory distress.  Neurological: He is alert and oriented to person, place, and time.  Skin: Skin is warm and dry. He is not diaphoretic.  Psychiatric: He has a normal mood and affect.  Nursing note and vitals reviewed.  Motor strength is 4/5 bilateral deltoid and bicep 3 at the wrist extensors 0 at wrist flexors and finger flexors 0 at finger extensors and hand intrinsics 0/5 in bilateral hip flexor knee extensor ankle dorsiflexors Tone no evidence of spasticity with passive range of motion of the upper or left lower limbs with exception of right ankle sustained clonus.        Assessment & Plan:  1.  Chronic lower back pain, uncomfortable sleep, in hospital bed No radicular symptomatology noted Continue tramadol 50 mg 3 times daily Urine drug screen from April 2019 reviewed and appropriate Controlled substance agreement signed PMP aware website reviewed, no red flags PMR f/u in 6 m  2.  C6 incomplete quadriplegia in motorized chair nonambulatory requiring assistance for a all ADLs, he has no needs for his neurogenic bladder, he is managing his neurogenic bowel with a suppository, his motorized wheelchair does not require any repairs

## 2018-10-04 ENCOUNTER — Encounter: Payer: Medicare Other | Attending: Registered Nurse | Admitting: Registered Nurse

## 2018-10-04 ENCOUNTER — Encounter: Payer: Self-pay | Admitting: Registered Nurse

## 2018-10-04 ENCOUNTER — Other Ambulatory Visit: Payer: Self-pay

## 2018-10-04 VITALS — Ht 70.0 in | Wt 190.0 lb

## 2018-10-04 DIAGNOSIS — N319 Neuromuscular dysfunction of bladder, unspecified: Secondary | ICD-10-CM | POA: Diagnosis not present

## 2018-10-04 DIAGNOSIS — S14106S Unspecified injury at C6 level of cervical spinal cord, sequela: Secondary | ICD-10-CM

## 2018-10-04 DIAGNOSIS — Z5181 Encounter for therapeutic drug level monitoring: Secondary | ICD-10-CM

## 2018-10-04 DIAGNOSIS — G8250 Quadriplegia, unspecified: Secondary | ICD-10-CM

## 2018-10-04 DIAGNOSIS — Z79899 Other long term (current) drug therapy: Secondary | ICD-10-CM

## 2018-10-04 DIAGNOSIS — K592 Neurogenic bowel, not elsewhere classified: Secondary | ICD-10-CM | POA: Diagnosis not present

## 2018-10-04 DIAGNOSIS — G894 Chronic pain syndrome: Secondary | ICD-10-CM

## 2018-10-04 MED ORDER — TRAMADOL HCL 50 MG PO TABS: 50.0000 mg | ORAL_TABLET | Freq: Four times a day (QID) | ORAL | 5 refills | Status: DC | PRN

## 2018-10-04 NOTE — Progress Notes (Signed)
Subjective:    Patient ID: Christopher Valencia, male    DOB: [birth year], 43 y.o.   MRN: [id number]  HPI: Christopher Valencia is a 43 y.o. male his appointment was changed, due to national recommendations of social distancing due to COVID 19, an audio/video telehealth visit is felt to be most appropriate for this patient at this time.  See Chart message from today for the patient's consent to telehealth from [facility].    He  states his  pain is located in his lower back. He rates his pain 4. His .current exercise regime is and performing stretching exercises he states. .  [name] Morphine equivalent is 20.00  MME.  Last Oral Swab was Performed on 09/16/2017, it was consistent.   [name] with medical history of C6 incomplete spinal cord injury, injury date 2000, C3-C6 fusion by Dr. Blanche East. He continues with Bowel and Bladder program. He's being  catheterize daily he reports.    Silas Sacramento CMA asked the Health and History questions. This provider and Angelica Chessman verified we were speaking with the correct person using two identifiers.   Pain Inventory Average Pain 3 Pain Right Now 4 My pain is sharp, burning, stabbing and tingling  In the last 24 hours, has pain interfered with the following? General activity 4 Relation with others 4 Enjoyment of life 4 What TIME of day is your pain at its worst? evening Sleep (in general) Good  Pain is worse with: sitting and laying Pain improves with: rest, therapy/exercise, pacing activities and medication Relief from Meds: 8  Mobility ability to climb steps?  no do you drive?  no use a wheelchair  Function disabled: date disabled na I need assistance with the following:  feeding, dressing, bathing, toileting, meal prep, household duties and shopping  Neuro/Psych bladder control problems bowel control problems weakness numbness tingling spasms  Prior Studies Any changes since last visit?  no   Physicians involved in your care Any changes since last visit?  no   No family history on file. Social History   Socioeconomic History  . Marital status: Single    Spouse name: Not on file  . Number of children: Not on file  . Years of education: Not on file  . Highest education level: Not on file  Occupational History  . Not on file  Social Needs  . Financial resource strain: Not on file  . Food insecurity:    Worry: Not on file    Inability: Not on file  . Transportation needs:    Medical: Not on file    Non-medical: Not on file  Tobacco Use  . Smoking status: Never Smoker  . Smokeless tobacco: Never Used  Substance and Sexual Activity  . Alcohol use: No  . Drug use: No  . Sexual activity: Not on file  Lifestyle  . Physical activity:    Days per week: Not on file    Minutes per session: Not on file  . Stress: Not on file  Relationships  . Social connections:    Talks on phone: Not on file    Gets together: Not on file    Attends religious service: Not on file    Active member of club or organization: Not on file    Attends meetings of clubs or organizations: Not on file    Relationship status: Not on file  Other Topics Concern  . Not on file  Social History Narrative  .  Not on file   Past Surgical History:  Procedure Laterality Date  . ABDOMINAL SURGERY    . BACK SURGERY     Past Medical History:  Diagnosis Date  . Paraplegia following spinal cord injury (HCC)   . Spinal cord injury, C5-C7 (HCC)    Ht 5\' 10"  (1.778 m)   Wt 190 lb (86.2 kg)   BMI 27.26 kg/m   Opioid Risk Score:   Fall Risk Score:  `1  Depression screen PHQ 2/9  Depression screen PHQ 2/9 10/04/2018  Decreased Interest 0  Down, Depressed, Hopeless 0  PHQ - 2 Score 0    Review of Systems  Constitutional: Negative.   HENT: Negative.   Eyes: Negative.   Respiratory: Negative.   Cardiovascular: Negative.   Gastrointestinal: Negative.   Endocrine: Negative.   Genitourinary:  Negative.   Musculoskeletal: Negative.   Skin: Negative.   Allergic/Immunologic: Negative.   Neurological: Positive for weakness and numbness.  Hematological: Negative.   Psychiatric/Behavioral: Negative.   All other systems reviewed and are negative.      Objective:   Physical Exam Vitals signs and nursing note reviewed.  Musculoskeletal:     Comments: No Physical Exam Perform: Virtual Visit  Neurological:     Mental Status: He is oriented to person, place, and time.           Assessment & Plan:  1. Chronic Lower Back Pain, No complaints of Radicular Pain:  Refilled: Tramadol 50 mg one tablet every 6 hours #120.  We will continue the opioid monitoring program, this consists of regular clinic visits, examinations, urine drug screen, pill counts as well as use of [place] Cont[place] Substance Reporting system. 2. C6 incomplete quadriplegia in motorized wheelchair, non ambulatory. He requires assistance with ADL's. He reports he's being catheterize daily and following his bowel program.   F/U in 6 months   [employer] Location of patient: In his Home Location of provider: Office Established patient Time spent on call: 15 Minutes

## 2018-11-30 ENCOUNTER — Other Ambulatory Visit: Payer: Self-pay | Admitting: Physical Medicine & Rehabilitation

## 2019-03-28 ENCOUNTER — Encounter: Payer: Medicare Other | Admitting: Physical Medicine & Rehabilitation

## 2019-04-13 ENCOUNTER — Other Ambulatory Visit: Payer: Self-pay

## 2019-04-13 ENCOUNTER — Encounter: Payer: Self-pay | Admitting: Physical Medicine & Rehabilitation

## 2019-04-13 ENCOUNTER — Encounter: Payer: Medicare Other | Attending: Physical Medicine & Rehabilitation | Admitting: Physical Medicine & Rehabilitation

## 2019-04-13 VITALS — BP 100/62 | HR 57 | Temp 97.5°F

## 2019-04-13 DIAGNOSIS — Z79891 Long term (current) use of opiate analgesic: Secondary | ICD-10-CM

## 2019-04-13 DIAGNOSIS — Z5181 Encounter for therapeutic drug level monitoring: Secondary | ICD-10-CM | POA: Insufficient documentation

## 2019-04-13 DIAGNOSIS — S14106S Unspecified injury at C6 level of cervical spinal cord, sequela: Secondary | ICD-10-CM | POA: Insufficient documentation

## 2019-04-13 DIAGNOSIS — G894 Chronic pain syndrome: Secondary | ICD-10-CM | POA: Diagnosis present

## 2019-04-13 DIAGNOSIS — K592 Neurogenic bowel, not elsewhere classified: Secondary | ICD-10-CM | POA: Diagnosis present

## 2019-04-13 DIAGNOSIS — G8250 Quadriplegia, unspecified: Secondary | ICD-10-CM

## 2019-04-13 DIAGNOSIS — N319 Neuromuscular dysfunction of bladder, unspecified: Secondary | ICD-10-CM | POA: Diagnosis present

## 2019-04-13 MED ORDER — TRAMADOL HCL 50 MG PO TABS: 50.0000 mg | ORAL_TABLET | Freq: Four times a day (QID) | ORAL | 5 refills | Status: DC | PRN

## 2019-04-13 NOTE — Patient Instructions (Signed)
You will see Dr Dagoberto Ligas who is a spinal cord injury specialist

## 2019-04-13 NOTE — Progress Notes (Signed)
Subjective:    Patient ID: Christopher Valencia, male    DOB: [birth year], 43 y.o.   MRN: [id number] 43 year old male with history of C6 incomplete spinal cord injury, injury date 2000, C3-C6 fusion. Dr. Blanche East HPI Pt did inpt rehab at [facility].  Had a complicated hospttal course requiring trach for a pneumonia.  No significant spasms  Uses "magic bullet" supp daily  Does not require ICP, uses urinal Is on a fluid schedule Drinks cranberry supplement 500mg  per day  Low back pain, chronic without radic on Tramadol, he gets good relief with this medication and has had no side effects. He has been accurate with his pill counts  Pain Inventory Average Pain 3 Pain Right Now 3 My pain is constant, sharp, burning, stabbing, tingling and aching  In the last 24 hours, has pain interfered with the following? General activity 2 Relation with others 0 Enjoyment of life 2 What TIME of day is your pain at its worst? morning daytime and evening Sleep (in general) Fair  Pain is worse with: sitting and inactivity Pain improves with: rest and medication Relief from Meds: 5  Mobility how many minutes can you walk? 0 ability to climb steps?  no do you drive?  no use a wheelchair needs help with transfers  Function disabled: date disabled . I need assistance with the following:  dressing, bathing, toileting, meal prep and household duties  Neuro/Psych bladder control problems bowel control problems weakness numbness tingling spasms  Prior Studies Any changes since last visit?  no  Physicians involved in your care Any changes since last visit?  no Primary care Dr.   No family history on file. Social History   Socioeconomic History  . Marital status: Single    Spouse name: Not on file  . Number of children: Not on file  . Years of education: Not on file  . Highest education level: Not on file  Occupational History  . Not on file  Social Needs  . Financial  resource strain: Not on file  . Food insecurity    Worry: Not on file    Inability: Not on file  . Transportation needs    Medical: Not on file    Non-medical: Not on file  Tobacco Use  . Smoking status: Never Smoker  . Smokeless tobacco: Never Used  Substance and Sexual Activity  . Alcohol use: No  . Drug use: No  . Sexual activity: Not on file  Lifestyle  . Physical activity    Days per week: Not on file    Minutes per session: Not on file  . Stress: Not on file  Relationships  . Social Ralene Ok on phone: Not on file    Gets together: Not on file    Attends religious service: Not on file    Active member of club or organization: Not on file    Attends meetings of clubs or organizations: Not on file    Relationship status: Not on file  Other Topics Concern  . Not on file  Social History Narrative  . Not on file   Past Surgical History:  Procedure Laterality Date  . ABDOMINAL SURGERY    . BACK SURGERY     Past Medical History:  Diagnosis Date  . Paraplegia following spinal cord injury (HCC)   . Spinal cord injury, C5-C7 (HCC)    BP 100/62   Pulse (!) 57   Temp (!) 97.5 F (36.4  C)   SpO2 96%   Opioid Risk Score:   Fall Risk Score:  `1  Depression screen PHQ 2/9  Depression screen PHQ 2/9 10/04/2018  Decreased Interest 0  Down, Depressed, Hopeless 0  PHQ - 2 Score 0    Review of Systems  Musculoskeletal:       Spasms  Neurological: Positive for weakness and numbness.       Tingling  All other systems reviewed and are negative.      Objective:   Physical Exam  RIght side   Delt 5/5 Biceps 5/5 WE 5/5 Triceps tr FE 0/5  LE o/5 throughout  Left side  Delt 5/5/ Biceps 5/5 Triceps 0/5 WE 3-/5 FE 0/5  Sensory Intact C5 Can detect LT  Right C6,7,8 Can detect LT Left C6,7,8 but reduced vs right  Has Left L4 preserved sensation but otherwise diminished LT BLE  Tone  MAS 0 in Bilateral Biceps, wrist, finger flexors , knee  flexors and extensor and ankles No clonus      Assessment & Plan:  #1.  C6 Asia B tetraplegia, slightly stronger on the right side than on the left side. He has neurogenic bowel and bladder but does not require catheterization. He is in a motorized chair. From a pain perspective he is doing well on tramadol 50 mg 4 times daily. PDMP reviewed no issues Will order oral swab for toxicology.  We discussed referral to Dr. Dagoberto Ligas, who is at this practice, given that she is a spinal cord injury specialist.  He is in favor of this.  I will see the patient back on a as needed basis

## 2019-04-18 LAB — DRUG TOX MONITOR 1 W/CONF, ORAL FLD

## 2019-04-18 LAB — DRUG TOX ALC METAB W/CON, ORAL FLD: Alcohol Metabolite: NEGATIVE ng/mL (ref <25)

## 2019-04-20 ENCOUNTER — Telehealth: Payer: Self-pay | Admitting: *Deleted

## 2019-04-20 NOTE — Telephone Encounter (Signed)
Oral swab drug screen was consistent for prescribed medications.  ?

## 2019-06-14 ENCOUNTER — Encounter: Payer: Medicare Other | Attending: Physical Medicine & Rehabilitation | Admitting: Physical Medicine and Rehabilitation

## 2019-06-14 ENCOUNTER — Other Ambulatory Visit: Payer: Self-pay

## 2019-06-14 ENCOUNTER — Encounter: Payer: Self-pay | Admitting: Physical Medicine and Rehabilitation

## 2019-06-14 VITALS — BP 120/75 | HR 54 | Temp 97.5°F | Ht 70.0 in | Wt 190.0 lb

## 2019-06-14 DIAGNOSIS — N319 Neuromuscular dysfunction of bladder, unspecified: Secondary | ICD-10-CM

## 2019-06-14 DIAGNOSIS — Z79891 Long term (current) use of opiate analgesic: Secondary | ICD-10-CM | POA: Diagnosis present

## 2019-06-14 DIAGNOSIS — S14106S Unspecified injury at C6 level of cervical spinal cord, sequela: Secondary | ICD-10-CM | POA: Diagnosis present

## 2019-06-14 DIAGNOSIS — Z5181 Encounter for therapeutic drug level monitoring: Secondary | ICD-10-CM | POA: Diagnosis present

## 2019-06-14 DIAGNOSIS — G8250 Quadriplegia, unspecified: Secondary | ICD-10-CM | POA: Diagnosis present

## 2019-06-14 DIAGNOSIS — K592 Neurogenic bowel, not elsewhere classified: Secondary | ICD-10-CM

## 2019-06-14 DIAGNOSIS — G894 Chronic pain syndrome: Secondary | ICD-10-CM | POA: Diagnosis not present
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
## 2019-09-13 ENCOUNTER — Other Ambulatory Visit: Payer: Self-pay

## 2019-09-13 ENCOUNTER — Encounter: Payer: Medicare Other | Attending: Physical Medicine & Rehabilitation | Admitting: Physical Medicine and Rehabilitation

## 2019-09-13 ENCOUNTER — Encounter: Payer: Self-pay | Admitting: Physical Medicine and Rehabilitation

## 2019-09-13 VITALS — BP 103/68 | HR 58 | Temp 97.5°F | Ht 70.0 in | Wt 186.0 lb

## 2019-09-13 DIAGNOSIS — Z5181 Encounter for therapeutic drug level monitoring: Secondary | ICD-10-CM | POA: Diagnosis present

## 2019-09-13 DIAGNOSIS — S14106S Unspecified injury at C6 level of cervical spinal cord, sequela: Secondary | ICD-10-CM | POA: Insufficient documentation

## 2019-09-13 DIAGNOSIS — Z993 Dependence on wheelchair: Secondary | ICD-10-CM | POA: Diagnosis not present

## 2019-09-13 DIAGNOSIS — G894 Chronic pain syndrome: Secondary | ICD-10-CM | POA: Insufficient documentation

## 2019-09-13 DIAGNOSIS — G8250 Quadriplegia, unspecified: Secondary | ICD-10-CM

## 2019-09-13 DIAGNOSIS — Z79891 Long term (current) use of opiate analgesic: Secondary | ICD-10-CM | POA: Insufficient documentation

## 2019-09-13 DIAGNOSIS — N319 Neuromuscular dysfunction of bladder, unspecified: Secondary | ICD-10-CM | POA: Diagnosis present

## 2019-09-13 DIAGNOSIS — K592 Neurogenic bowel, not elsewhere classified: Secondary | ICD-10-CM | POA: Diagnosis present

## 2019-09-13 MED ORDER — TRAMADOL HCL 50 MG PO TABS: 50.0000 mg | ORAL_TABLET | Freq: Four times a day (QID) | ORAL | 5 refills | Status: DC | PRN
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
## 2020-02-16 ENCOUNTER — Encounter (HOSPITAL_BASED_OUTPATIENT_CLINIC_OR_DEPARTMENT_OTHER): Payer: Self-pay | Admitting: Emergency Medicine

## 2020-02-16 ENCOUNTER — Emergency Department (HOSPITAL_BASED_OUTPATIENT_CLINIC_OR_DEPARTMENT_OTHER)
Admission: EM | Admit: 2020-02-16 | Discharge: 2020-02-16 | Disposition: A | Discharge status: Home / Self Care | Payer: Medicare Other | Attending: Emergency Medicine | Admitting: Emergency Medicine

## 2020-02-16 ENCOUNTER — Other Ambulatory Visit: Payer: Self-pay

## 2020-02-16 DIAGNOSIS — M549 Dorsalgia, unspecified: Secondary | ICD-10-CM | POA: Insufficient documentation

## 2020-02-16 DIAGNOSIS — M62838 Other muscle spasm: Secondary | ICD-10-CM

## 2020-02-16 MED ORDER — CYCLOBENZAPRINE HCL 10 MG PO TABS: 10.0000 mg | ORAL_TABLET | Freq: Three times a day (TID) | ORAL | 0 refills | Status: DC | PRN

## 2020-02-16 MED FILL — CYCLOBENZAPRINE HCL 10 MG T: 10 | 7 days supply | Qty: 20 | Fill #0

## 2020-02-16 NOTE — ED Provider Notes (Signed)
[facility] EMERGENCY DEPARTMENT Provider Note   CSN: [id number] Arrival date & time: 02/16/20  1033     History Chief Complaint  Patient presents with  . Back Pain    lower    Christopher Valencia is a 44 y.o. male.  HPI Patient is a 44 year old male with a history of C5-C7 spinal cord injury 21 years ago who is wheelchair-bound.  He presents today due to muscle spasms.  Patient states that he was lying in his hospital bed yesterday and attempted to open a refrigerator with his right arm and felt as if he pulled a muscle on the right side of his back.  He has done this many times in the past and states that when he experiences a pulled muscle he will many times not experience pain in the region but will instead experience diffuse muscle spasms that occur intermittently throughout his body.  These are typically alleviated with muscle relaxers.  He reports associated difficulty sleeping due to this but has no other physical complaints at this time.  No fevers, chills, chest pain, shortness of breath, abdominal pain, nausea, vomiting, urinary changes.  He lives with his parents who help him with his ADLs.    Past Medical History:  Diagnosis Date  . Paraplegia following spinal cord injury (HCC)   . Spinal cord injury, C5-C7 [facility])     Patient Active Problem List   Diagnosis Date Noted  . Wheelchair dependence 09/13/2019  . Neurogenic bowel 06/11/2015  . Neurogenic bladder 06/11/2015  . Chronic spastic tetraplegia (HCC) 09/24/2014  . Injury at C6 level of cervical spinal cord (HCC) 01/01/2012    Past Surgical History:  Procedure Laterality Date  . ABDOMINAL SURGERY    . BACK SURGERY         No family history on file.  Social History   Tobacco Use  . Smoking status: Never Smoker  . Smokeless tobacco: Never Used  Substance Use Topics  . Alcohol use: No  . Drug use: No    Home Medications Prior to Admission medications   Medication Sig Start Date End Date Taking?  Authorizing Provider  traMADol (ULTRAM) 50 MG tablet Take 1 tablet (50 mg total) by mouth every 6 (six) hours as needed. 09/13/19   Genice Rouge, MD    Allergies    Patient has no known allergies.  Review of Systems   Review of Systems  Constitutional: Negative for chills and fever.  Respiratory: Negative for shortness of breath.   Cardiovascular: Negative for chest pain.  Gastrointestinal: Negative for abdominal pain, nausea and vomiting.  Musculoskeletal: Positive for back pain and myalgias. Negative for arthralgias.    Physical Exam Updated Vital Signs BP 110/82   Pulse 82   Temp 99.2 F (37.3 C) (Oral)   Resp 18   Ht 5\' 10"  (1.778 m)   Wt 83.5 kg   SpO2 96%   BMI 26.40 kg/m   Physical Exam Vitals and nursing note reviewed.  Constitutional:      General: He is not in acute distress.    Appearance: Normal appearance. He is not ill-appearing, toxic-appearing or diaphoretic.  HENT:     Head: Normocephalic and atraumatic.     Right Ear: External ear normal.     Left Ear: External ear normal.     Nose: Nose normal.     Mouth/Throat:     Mouth: Mucous membranes are moist.     Pharynx: Oropharynx is clear. No oropharyngeal exudate or  posterior oropharyngeal erythema.  Eyes:     Extraocular Movements: Extraocular movements intact.  Cardiovascular:     Rate and Rhythm: Normal rate and regular rhythm.     Pulses: Normal pulses.     Heart sounds: Normal heart sounds. No murmur heard.  No friction rub. No gallop.   Pulmonary:     Effort: Pulmonary effort is normal. No respiratory distress.     Breath sounds: Normal breath sounds. No stridor. No wheezing, rhonchi or rales.  Abdominal:     General: Abdomen is flat.     Palpations: Abdomen is soft.     Tenderness: There is no abdominal tenderness.  Musculoskeletal:        General: Normal range of motion.     Cervical back: Normal range of motion and neck supple. No tenderness.     Comments: No midline C, T, L-spine  tenderness.  No palpable spasm noted in the back.  No tenderness noted throughout the musculature of the back.  Skin:    General: Skin is warm and dry.  Neurological:     Mental Status: He is alert and oriented to person, place, and time.     Comments: History of C5-C7 spinal cord injury with chronic deficits in all 4 extremities, left greater than right.  Strength is 5 out of 5 with flexion in bilateral upper extremities.  Strength 3 out of 5 with extension of the upper extremities.  Unable to assess strength in the lower extremities.  Psychiatric:        Mood and Affect: Mood normal.        Behavior: Behavior normal.    ED Results / Procedures / Treatments   Labs (all labs ordered are listed, but only abnormal results are displayed) Labs Reviewed - No data to display  EKG None  Radiology No results found.  Procedures Procedures (including critical care time)  Medications Ordered in ED Medications - No data to display  ED Course  I have reviewed the triage vital signs and the nursing notes.  Pertinent labs & imaging results that were available during my care of the patient were reviewed by me and considered in my medical decision making (see chart for details).    MDM Rules/Calculators/A&P                          Pt is a 44 y.o. male that presents with a history, physical exam, and ED Clinical Course as noted above.   Patient presents today due to muscle spasms.  Patient has been wheelchair-bound for the past 21 years and notes that previously when he experiences injuries instead of focal pain he will instead experience diffuse intermittent muscle spasms.  These cause difficulty sleeping but otherwise no other complications.  He has no other complaints today.  He states he has taken muscle relaxers for the symptoms in the past with improvement.  Will discharge on a short course of Flexeril.  We discussed safety regarding this medication.  He understands to follow-up with  his primary care provider regarding his symptoms.  He can always return to the emergency department with new or worsening symptoms.  His questions were answered and he was amicable at time of discharge.  His vital signs are stable.   An After Visit Summary was printed and given to the patient.  Patient discharged to home/self care.  Condition at discharge: Stable  Note: Portions of this report may have been transcribed  using voice recognition software. Every effort was made to ensure accuracy; however, inadvertent computerized transcription errors may be present.   Final Clinical Impression(s) / ED Diagnoses Final diagnoses:  Muscle spasm    Rx / DC Orders ED Discharge Orders         Ordered    cyclobenzaprine (FLEXERIL) 10 MG tablet  3 times daily PRN        02/16/20 1421           Placido Sou, PA-C 02/16/20 1422    Melene Plan, DO 02/16/20 1454

## 2020-02-16 NOTE — Discharge Instructions (Addendum)
I am prescribing you a strong muscle relaxer called flexeril. This medication can make you quite drowsy. Do not mix it with alcohol. Do not drive a vehicle after taking it.   Please return to the ER with any new or worsening symptoms.  Please follow-up with your regular doctor.  It was a pleasure to meet you.

## 2020-02-16 NOTE — ED Triage Notes (Signed)
Pt reports "pulled muscle" while opening fridge door. Wheelchair bidden x 20 yrs

## 2020-02-17 ENCOUNTER — Emergency Department (HOSPITAL_COMMUNITY)
Admission: EM | Admit: 2020-02-17 | Discharge: 2020-02-17 | Disposition: A | Discharge status: Home / Self Care | Payer: Medicare Other | Attending: Emergency Medicine | Admitting: Emergency Medicine

## 2020-02-17 ENCOUNTER — Emergency Department (HOSPITAL_COMMUNITY): Payer: Medicare Other

## 2020-02-17 DIAGNOSIS — Z20822 Contact with and (suspected) exposure to covid-19: Secondary | ICD-10-CM | POA: Diagnosis not present

## 2020-02-17 DIAGNOSIS — M791 Myalgia, unspecified site: Secondary | ICD-10-CM | POA: Insufficient documentation

## 2020-02-17 DIAGNOSIS — Z79899 Other long term (current) drug therapy: Secondary | ICD-10-CM | POA: Insufficient documentation

## 2020-02-17 DIAGNOSIS — R509 Fever, unspecified: Secondary | ICD-10-CM

## 2020-02-17 DIAGNOSIS — R6889 Other general symptoms and signs: Secondary | ICD-10-CM

## 2020-02-17 LAB — URINALYSIS, ROUTINE W REFLEX MICROSCOPIC
Bilirubin Urine: NEGATIVE
Glucose, UA: NEGATIVE mg/dL
Ketones, ur: >80 mg/dL — AB
Leukocytes,Ua: NEGATIVE
Nitrite: NEGATIVE
Protein, ur: 100 mg/dL — AB
Specific Gravity, Urine: 1.025 (ref 1.005–1.030)
pH: 6 (ref 5.0–8.0)

## 2020-02-17 LAB — URINALYSIS, MICROSCOPIC (REFLEX)

## 2020-02-17 LAB — SARS CORONAVIRUS 2 BY RT PCR (HOSPITAL ORDER, PERFORMED IN ~~LOC~~ HOSPITAL LAB): SARS Coronavirus 2: NEGATIVE

## 2020-02-17 MED ORDER — IBUPROFEN 800 MG PO TABS
800.0000 mg | ORAL_TABLET | Freq: Once | ORAL | Status: AC
  Administered 2020-02-17: 800 mg via ORAL
  Filled 2020-02-17: qty 1

## 2020-02-17 MED ORDER — CEPHALEXIN 500 MG PO CAPS: 500.0000 mg | ORAL_CAPSULE | Freq: Four times a day (QID) | ORAL | 0 refills | Status: DC

## 2020-02-17 MED ORDER — ACETAMINOPHEN 500 MG PO TABS
1000.0000 mg | ORAL_TABLET | Freq: Once | ORAL | Status: AC
  Administered 2020-02-17: 1000 mg via ORAL
  Filled 2020-02-17: qty 2

## 2020-02-17 NOTE — Discharge Instructions (Signed)
Your chest x-ray did not show pneumonia.  Please return for worsening symptoms.  Take tylenol 2 pills 4 times a day and motrin 4 pills 3 times a day.  Drink plenty of fluids.  Return for worsening shortness of breath, headache, confusion. Follow up with your family doctor.

## 2020-02-17 NOTE — ED Provider Notes (Signed)
[name] EMERGENCY DEPARTMENT Provider Note   CSN: [id number] Arrival date & time:        History Chief Complaint  Patient presents with  . Fever    Christopher Valencia is a 44 y.o. male.  44 yo M with a chief complaints of fever.  Patient states that he has been having muscle spasms and chills.  Has been feeling hot off and on.  Fevers really started last night.  He was having some spasms after opening a refrigerator door and thought he may have pulled a muscle.  Has been seen at the [facility] emergency department and was prescribed to a muscle relaxant.  Has continued to have the spasms but now has felt very hot with them.  He denies cough or congestion.  Denies abdominal pain.  He is paralyzed from the waist down but does have sensation to his abdomen.  Last time he had a fever like this he had a urinary tract infection.  Thinks he has the same now.  The history is provided by the patient.  Fever Associated symptoms: chills and myalgias   Associated symptoms: no chest pain, no confusion, no congestion, no diarrhea, no headaches, no rash and no vomiting   Illness Severity:  Moderate Onset quality:  Gradual Duration:  2 days Timing:  Constant Progression:  Worsening Chronicity:  New Associated symptoms: fever and myalgias   Associated symptoms: no abdominal pain, no chest pain, no congestion, no diarrhea, no headaches, no rash, no shortness of breath and no vomiting        Past Medical History:  Diagnosis Date  . Paraplegia following spinal cord injury (HCC)   . Spinal cord injury, C5-C7 [facility])     Patient Active Problem List   Diagnosis Date Noted  . Wheelchair dependence 09/13/2019  . Neurogenic bowel 06/11/2015  . Neurogenic bladder 06/11/2015  . Chronic spastic tetraplegia (HCC) 09/24/2014  . Injury at C6 level of cervical spinal cord (HCC) 01/01/2012    Past Surgical History:  Procedure Laterality Date  . ABDOMINAL SURGERY    .  BACK SURGERY         No family history on file.  Social History   Tobacco Use  . Smoking status: Never Smoker  . Smokeless tobacco: Never Used  Substance Use Topics  . Alcohol use: No  . Drug use: No    Home Medications Prior to Admission medications   Medication Sig Start Date End Date Taking? Authorizing Provider  cephALEXin (KEFLEX) 500 MG capsule Take 1 capsule (500 mg total) by mouth 4 (four) times daily. 02/17/20   Melene Plan, DO  cyclobenzaprine (FLEXERIL) 10 MG tablet Take 1 tablet (10 mg total) by mouth 3 (three) times daily as needed for muscle spasms. 02/16/20   Placido Sou, PA-C  traMADol (ULTRAM) 50 MG tablet Take 1 tablet (50 mg total) by mouth every 6 (six) hours as needed. 09/13/19   Genice Rouge, MD    Allergies    Patient has no known allergies.  Review of Systems   Review of Systems  Constitutional: Positive for chills and fever.  HENT: Negative for congestion and facial swelling.   Eyes: Negative for discharge and visual disturbance.  Respiratory: Negative for shortness of breath.   Cardiovascular: Negative for chest pain and palpitations.  Gastrointestinal: Negative for abdominal pain, diarrhea and vomiting.  Musculoskeletal: Positive for myalgias. Negative for arthralgias.  Skin: Negative for color change and rash.  Neurological: Negative for  tremors, syncope and headaches.  Psychiatric/Behavioral: Negative for confusion and dysphoric mood.    Physical Exam Updated Vital Signs BP 103/71 (BP Location: Right Arm)   Pulse (!) 128   Temp (!) 102.3 F (39.1 C) (Oral)   Resp 16   Ht 5\' 10"  (1.778 m)   Wt 83.9 kg   SpO2 95%   BMI 26.54 kg/m   Physical Exam Vitals and nursing note reviewed.  Constitutional:      Appearance: He is well-developed.  HENT:     Head: Normocephalic and atraumatic.  Eyes:     Pupils: Pupils are equal, round, and reactive to light.  Neck:     Vascular: No JVD.  Cardiovascular:     Rate and Rhythm: Normal rate  and regular rhythm.     Heart sounds: No murmur heard.  No friction rub. No gallop.   Pulmonary:     Effort: No respiratory distress.     Breath sounds: No wheezing.  Abdominal:     General: There is no distension.     Tenderness: There is no abdominal tenderness. There is no guarding or rebound.  Musculoskeletal:     Cervical back: Normal range of motion and neck supple.  Skin:    Coloration: Skin is not pale.     Findings: No rash.  Neurological:     Mental Status: He is alert and oriented to person, place, and time.  Psychiatric:        Behavior: Behavior normal.     ED Results / Procedures / Treatments   Labs (all labs ordered are listed, but only abnormal results are displayed) Labs Reviewed  URINALYSIS, ROUTINE W REFLEX MICROSCOPIC - Abnormal; Notable for the following components:      Result Value   APPearance HAZY (*)    Hgb urine dipstick LARGE (*)    Ketones, ur >80 (*)    Protein, ur 100 (*)    All other components within normal limits  URINALYSIS, MICROSCOPIC (REFLEX) - Abnormal; Notable for the following components:   Bacteria, UA RARE (*)    All other components within normal limits  URINE CULTURE  SARS CORONAVIRUS 2 BY RT PCR (HOSPITAL ORDER, PERFORMED IN [facility] LAB)    EKG None  Radiology DG Chest Port 1 View  Result Date: 02/17/2020 CLINICAL DATA:  Fever EXAM: PORTABLE CHEST 1 VIEW COMPARISON:  November 08, 2012 FINDINGS: The lungs are clear. The heart size and pulmonary vascularity are normal. No adenopathy. There is postoperative change in the visualized cervical spine. IMPRESSION: Lungs clear.  Cardiac silhouette normal. Electronically Signed   By: November 10, 2012 III M.D.   On: 02/17/2020 13:33    Procedures Procedures (including critical care time)  Medications Ordered in ED Medications  acetaminophen (TYLENOL) tablet 1,000 mg (1,000 mg Oral Given 02/17/20 1110)  ibuprofen (ADVIL) tablet 800 mg (800 mg Oral Given 02/17/20 1111)     ED Course  I have reviewed the triage vital signs and the nursing notes.  Pertinent labs & imaging results that were available during my care of the patient were reviewed by me and considered in my medical decision making (see chart for details).  Clinical Course as of Feb 17 1347  Sat Feb 17, 2020  1248 Ua without obvious infection, will cxr, covid test   [DF]    Clinical Course User Index [DF] Feb 19, 2020, DO   MDM Rules/Calculators/A&P  44 yo M with a cc of a fever.  Started last night, has been having rigors.  Was seen recently and thought to be muscle spasms.  Prescribed a muscle relaxants without improvement.  Will obtain a UA.  If this does not appear to be a source we will obtain a chest x-ray had a Covid test.  Chest x-ray viewed by me without focal infiltrate.  As the patient has had recurrent urinary tract infections in the past and this feels similar why we will start him on antibiotics.  We will have him follow-up with his family doctor in the office.  1:48 PM:  I have discussed the diagnosis/risks/treatment options with the patient and believe the pt to be eligible for discharge home to follow-up with PCP. We also discussed returning to the ED immediately if new or worsening sx occur. We discussed the sx which are most concerning (e.g., sudden worsening pain,  inability to tolerate by mouth) that necessitate immediate return. Medications administered to the patient during their visit and any new prescriptions provided to the patient are listed below.  Medications given during this visit Medications  acetaminophen (TYLENOL) tablet 1,000 mg (1,000 mg Oral Given 02/17/20 1110)  ibuprofen (ADVIL) tablet 800 mg (800 mg Oral Given 02/17/20 1111)     The patient appears reasonably screen and/or stabilized for discharge and I doubt any other medical condition or other [facility] requiring further screening, evaluation, or treatment in the ED at this time prior  to discharge.    Final Clinical Impression(s) / ED Diagnoses Final diagnoses:  Rigors  Fever in adult    Rx / DC Orders ED Discharge Orders         Ordered    cephALEXin (KEFLEX) 500 MG capsule  4 times daily        02/17/20 1347           Melene Plan, DO 02/17/20 1348

## 2020-02-17 NOTE — ED Triage Notes (Signed)
Pt from home, called EMS for fever and tremors. Pt thinks he has a UTI because the last time he had fevers it was a UTI. Seen at [facility] for same two days ago and prescribed Flexeril. Denies pain at this time. Tmax 102 this morning per patient.

## 2020-02-18 LAB — URINE CULTURE: Culture: <10000 — AB

## 2020-02-27 ENCOUNTER — Emergency Department (HOSPITAL_COMMUNITY): Payer: Medicare Other

## 2020-02-27 ENCOUNTER — Telehealth: Payer: Self-pay | Admitting: *Deleted

## 2020-02-27 ENCOUNTER — Other Ambulatory Visit: Payer: Self-pay

## 2020-02-27 ENCOUNTER — Inpatient Hospital Stay (HOSPITAL_COMMUNITY)
Admission: EM | Admit: 2020-02-27 | Discharge: 2020-03-06 | DRG: 391 | Disposition: A | Discharge status: Home / Self Care | Payer: Medicare Other | Attending: Internal Medicine | Admitting: Internal Medicine

## 2020-02-27 ENCOUNTER — Encounter (HOSPITAL_COMMUNITY): Payer: Self-pay

## 2020-02-27 DIAGNOSIS — Z993 Dependence on wheelchair: Secondary | ICD-10-CM | POA: Diagnosis not present

## 2020-02-27 DIAGNOSIS — E871 Hypo-osmolality and hyponatremia: Secondary | ICD-10-CM | POA: Diagnosis present

## 2020-02-27 DIAGNOSIS — R700 Elevated erythrocyte sedimentation rate: Secondary | ICD-10-CM | POA: Diagnosis present

## 2020-02-27 DIAGNOSIS — Z8249 Family history of ischemic heart disease and other diseases of the circulatory system: Secondary | ICD-10-CM | POA: Diagnosis not present

## 2020-02-27 DIAGNOSIS — A419 Sepsis, unspecified organism: Secondary | ICD-10-CM | POA: Insufficient documentation

## 2020-02-27 DIAGNOSIS — R6521 Severe sepsis with septic shock: Secondary | ICD-10-CM | POA: Insufficient documentation

## 2020-02-27 DIAGNOSIS — E860 Dehydration: Secondary | ICD-10-CM | POA: Diagnosis not present

## 2020-02-27 DIAGNOSIS — G8220 Paraplegia, unspecified: Secondary | ICD-10-CM | POA: Diagnosis not present

## 2020-02-27 DIAGNOSIS — R609 Edema, unspecified: Secondary | ICD-10-CM | POA: Diagnosis not present

## 2020-02-27 DIAGNOSIS — S39012A Strain of muscle, fascia and tendon of lower back, initial encounter: Secondary | ICD-10-CM | POA: Diagnosis present

## 2020-02-27 DIAGNOSIS — M6282 Rhabdomyolysis: Secondary | ICD-10-CM | POA: Diagnosis not present

## 2020-02-27 DIAGNOSIS — I959 Hypotension, unspecified: Secondary | ICD-10-CM | POA: Diagnosis present

## 2020-02-27 DIAGNOSIS — R7989 Other specified abnormal findings of blood chemistry: Secondary | ICD-10-CM | POA: Diagnosis present

## 2020-02-27 DIAGNOSIS — G8250 Quadriplegia, unspecified: Secondary | ICD-10-CM | POA: Diagnosis present

## 2020-02-27 DIAGNOSIS — Z20822 Contact with and (suspected) exposure to covid-19: Secondary | ICD-10-CM | POA: Diagnosis present

## 2020-02-27 DIAGNOSIS — K529 Noninfective gastroenteritis and colitis, unspecified: Secondary | ICD-10-CM | POA: Diagnosis not present

## 2020-02-27 DIAGNOSIS — E876 Hypokalemia: Secondary | ICD-10-CM | POA: Diagnosis not present

## 2020-02-27 DIAGNOSIS — Z79899 Other long term (current) drug therapy: Secondary | ICD-10-CM

## 2020-02-27 DIAGNOSIS — R251 Tremor, unspecified: Secondary | ICD-10-CM | POA: Diagnosis present

## 2020-02-27 DIAGNOSIS — R509 Fever, unspecified: Secondary | ICD-10-CM | POA: Diagnosis not present

## 2020-02-27 DIAGNOSIS — K5909 Other constipation: Secondary | ICD-10-CM | POA: Diagnosis present

## 2020-02-27 DIAGNOSIS — N319 Neuromuscular dysfunction of bladder, unspecified: Secondary | ICD-10-CM | POA: Diagnosis not present

## 2020-02-27 DIAGNOSIS — R748 Abnormal levels of other serum enzymes: Secondary | ICD-10-CM | POA: Diagnosis present

## 2020-02-27 DIAGNOSIS — E872 Acidosis: Secondary | ICD-10-CM | POA: Diagnosis present

## 2020-02-27 DIAGNOSIS — G904 Autonomic dysreflexia: Secondary | ICD-10-CM | POA: Diagnosis not present

## 2020-02-27 DIAGNOSIS — R030 Elevated blood-pressure reading, without diagnosis of hypertension: Secondary | ICD-10-CM | POA: Diagnosis present

## 2020-02-27 DIAGNOSIS — Z7401 Bed confinement status: Secondary | ICD-10-CM | POA: Diagnosis not present

## 2020-02-27 DIAGNOSIS — R6510 Systemic inflammatory response syndrome (SIRS) of non-infectious origin without acute organ dysfunction: Secondary | ICD-10-CM | POA: Diagnosis present

## 2020-02-27 DIAGNOSIS — K592 Neurogenic bowel, not elsewhere classified: Secondary | ICD-10-CM | POA: Diagnosis present

## 2020-02-27 DIAGNOSIS — M609 Myositis, unspecified: Secondary | ICD-10-CM | POA: Diagnosis present

## 2020-02-27 LAB — URINALYSIS, ROUTINE W REFLEX MICROSCOPIC
Bilirubin Urine: NEGATIVE
Glucose, UA: NEGATIVE mg/dL
Ketones, ur: NEGATIVE mg/dL
Leukocytes,Ua: NEGATIVE
Nitrite: NEGATIVE
Protein, ur: NEGATIVE mg/dL
Specific Gravity, Urine: 1.008 (ref 1.005–1.030)
pH: 6 (ref 5.0–8.0)

## 2020-02-27 LAB — CBC WITH DIFFERENTIAL/PLATELET
Abs Immature Granulocytes: 0.1 10*3/uL — ABNORMAL HIGH (ref 0.00–0.07)
Basophils Absolute: 0.1 10*3/uL (ref 0.0–0.1)
Basophils Relative: 1 %
Eosinophils Absolute: 0 10*3/uL (ref 0.0–0.5)
Eosinophils Relative: 0 %
HCT: 36.6 % — ABNORMAL LOW (ref 39.0–52.0)
Hemoglobin: 12 g/dL — ABNORMAL LOW (ref 13.0–17.0)
Immature Granulocytes: 1 %
Lymphocytes Relative: 14 %
Lymphs Abs: 1.1 10*3/uL (ref 0.7–4.0)
MCH: 27.1 pg (ref 26.0–34.0)
MCHC: 32.8 g/dL (ref 30.0–36.0)
MCV: 82.8 fL (ref 80.0–100.0)
Monocytes Absolute: 0.9 10*3/uL (ref 0.1–1.0)
Monocytes Relative: 11 %
Neutro Abs: 5.7 10*3/uL (ref 1.7–7.7)
Neutrophils Relative %: 73 %
Platelets: 443 10*3/uL — ABNORMAL HIGH (ref 150–400)
RBC: 4.42 MIL/uL (ref 4.22–5.81)
RDW: 14.9 % (ref 11.5–15.5)
WBC: 7.9 10*3/uL (ref 4.0–10.5)
nRBC: 0 % (ref 0.0–0.2)

## 2020-02-27 LAB — PROTIME-INR
INR: 1.1 (ref 0.8–1.2)
Prothrombin Time: 13.7 seconds (ref 11.4–15.2)

## 2020-02-27 LAB — COMPREHENSIVE METABOLIC PANEL
ALT: 57 U/L — ABNORMAL HIGH (ref 0–44)
AST: 113 U/L — ABNORMAL HIGH (ref 15–41)
Albumin: 3 g/dL — ABNORMAL LOW (ref 3.5–5.0)
Alkaline Phosphatase: 32 U/L — ABNORMAL LOW (ref 38–126)
Anion gap: 9 (ref 5–15)
BUN: 8 mg/dL (ref 6–20)
CO2: 24 mmol/L (ref 22–32)
Calcium: 8 mg/dL — ABNORMAL LOW (ref 8.9–10.3)
Chloride: 94 mmol/L — ABNORMAL LOW (ref 98–111)
Creatinine, Ser: 1.11 mg/dL (ref 0.61–1.24)
GFR calc Af Amer: >60 mL/min (ref >60)
GFR calc non Af Amer: >60 mL/min (ref >60)
Glucose, Bld: 100 mg/dL — ABNORMAL HIGH (ref 70–99)
Potassium: 3.7 mmol/L (ref 3.5–5.1)
Sodium: 127 mmol/L — ABNORMAL LOW (ref 135–145)
Total Bilirubin: 0.6 mg/dL (ref 0.3–1.2)
Total Protein: 6 g/dL — ABNORMAL LOW (ref 6.5–8.1)

## 2020-02-27 LAB — C-REACTIVE PROTEIN: CRP: 1.3 mg/dL — ABNORMAL HIGH (ref <1.0)

## 2020-02-27 LAB — PHOSPHORUS: Phosphorus: 2.9 mg/dL (ref 2.5–4.6)

## 2020-02-27 LAB — SEDIMENTATION RATE: Sed Rate: 37 mm/hr — ABNORMAL HIGH (ref 0–16)

## 2020-02-27 LAB — TSH: TSH: 1.591 u[IU]/mL (ref 0.350–4.500)

## 2020-02-27 LAB — BASIC METABOLIC PANEL
Anion gap: 11 (ref 5–15)
BUN: 8 mg/dL (ref 6–20)
CO2: 24 mmol/L (ref 22–32)
Calcium: 7.8 mg/dL — ABNORMAL LOW (ref 8.9–10.3)
Chloride: 94 mmol/L — ABNORMAL LOW (ref 98–111)
Creatinine, Ser: 0.96 mg/dL (ref 0.61–1.24)
GFR calc Af Amer: >60 mL/min (ref >60)
GFR calc non Af Amer: >60 mL/min (ref >60)
Glucose, Bld: 85 mg/dL (ref 70–99)
Potassium: 3.8 mmol/L (ref 3.5–5.1)
Sodium: 129 mmol/L — ABNORMAL LOW (ref 135–145)

## 2020-02-27 LAB — HEPATITIS PANEL, ACUTE
HCV Ab: NONREACTIVE
Hep A IgM: NONREACTIVE
Hep B C IgM: NONREACTIVE
Hepatitis B Surface Ag: NONREACTIVE

## 2020-02-27 LAB — MAGNESIUM: Magnesium: 2.1 mg/dL (ref 1.7–2.4)

## 2020-02-27 LAB — LACTIC ACID, PLASMA
Lactic Acid, Venous: 2.5 mmol/L (ref 0.5–1.9)
Lactic Acid, Venous: 1.4 mmol/L (ref 0.5–1.9)

## 2020-02-27 LAB — AMMONIA: Ammonia: 20 umol/L (ref 9–35)

## 2020-02-27 LAB — OSMOLALITY: Osmolality: 277 mOsm/kg (ref 275–295)

## 2020-02-27 LAB — LIPASE, BLOOD: Lipase: 45 U/L (ref 11–51)

## 2020-02-27 LAB — HIV ANTIBODY (ROUTINE TESTING W REFLEX): HIV Screen 4th Generation wRfx: NONREACTIVE

## 2020-02-27 LAB — SARS CORONAVIRUS 2 BY RT PCR (HOSPITAL ORDER, PERFORMED IN ~~LOC~~ HOSPITAL LAB): SARS Coronavirus 2: NEGATIVE

## 2020-02-27 MED ORDER — ENOXAPARIN SODIUM 40 MG/0.4ML ~~LOC~~ SOLN
40.0000 mg | SUBCUTANEOUS | Status: DC
  Administered 2020-02-27 – 2020-03-05 (×8): 40 mg via SUBCUTANEOUS
  Filled 2020-02-27 (×8): qty 0.4

## 2020-02-27 MED ORDER — SODIUM CHLORIDE 0.9 % IV BOLUS
1000.0000 mL | Freq: Once | INTRAVENOUS | Status: AC
  Administered 2020-02-27: 1000 mL via INTRAVENOUS

## 2020-02-27 MED ORDER — SODIUM CHLORIDE 0.9 % IV SOLN
2.0000 g | INTRAVENOUS | Status: DC
  Administered 2020-02-27 – 2020-03-02 (×5): 2 g via INTRAVENOUS
  Filled 2020-02-27 (×5): qty 20

## 2020-02-27 MED ORDER — OXYCODONE HCL 5 MG PO TABS
5.0000 mg | ORAL_TABLET | ORAL | Status: DC | PRN
  Administered 2020-03-01 – 2020-03-04 (×3): 5 mg via ORAL
  Filled 2020-02-27 (×3): qty 1

## 2020-02-27 MED ORDER — SODIUM CHLORIDE 0.9 % IV SOLN
2.0000 g | Freq: Once | INTRAVENOUS | Status: AC
  Administered 2020-02-27: 2 g via INTRAVENOUS
  Filled 2020-02-27: qty 2

## 2020-02-27 MED ORDER — LACTATED RINGERS IV BOLUS
500.0000 mL | Freq: Once | INTRAVENOUS | Status: AC
  Administered 2020-02-27: 500 mL via INTRAVENOUS

## 2020-02-27 MED ORDER — LACTATED RINGERS IV SOLN: INTRAVENOUS | Status: AC

## 2020-02-27 MED ORDER — VANCOMYCIN HCL IN DEXTROSE 1-5 GM/200ML-% IV SOLN: 1000.0000 mg | Freq: Two times a day (BID) | INTRAVENOUS | Status: DC

## 2020-02-27 MED ORDER — ONDANSETRON HCL 4 MG PO TABS: 4.0000 mg | ORAL_TABLET | Freq: Four times a day (QID) | ORAL | Status: DC | PRN

## 2020-02-27 MED ORDER — DIAZEPAM 5 MG/ML IJ SOLN
2.5000 mg | Freq: Once | INTRAMUSCULAR | Status: AC
  Administered 2020-02-27: 2.5 mg via INTRAVENOUS
  Filled 2020-02-27: qty 2

## 2020-02-27 MED ORDER — IOHEXOL 300 MG/ML  SOLN
100.0000 mL | Freq: Once | INTRAMUSCULAR | Status: AC | PRN
  Administered 2020-02-27: 100 mL via INTRAVENOUS

## 2020-02-27 MED ORDER — VANCOMYCIN HCL 1750 MG/350ML IV SOLN
1750.0000 mg | Freq: Once | INTRAVENOUS | Status: AC
  Administered 2020-02-27: 1750 mg via INTRAVENOUS
  Filled 2020-02-27: qty 350

## 2020-02-27 MED ORDER — LACTATED RINGERS IV BOLUS (SEPSIS)
1000.0000 mL | Freq: Once | INTRAVENOUS | Status: AC
  Administered 2020-02-27: 1000 mL via INTRAVENOUS

## 2020-02-27 MED ORDER — METRONIDAZOLE IN NACL 5-0.79 MG/ML-% IV SOLN
500.0000 mg | Freq: Once | INTRAVENOUS | Status: AC
  Administered 2020-02-27: 500 mg via INTRAVENOUS
  Filled 2020-02-27: qty 100

## 2020-02-27 MED ORDER — ACETAMINOPHEN 650 MG RE SUPP: 650.0000 mg | Freq: Four times a day (QID) | RECTAL | Status: DC | PRN

## 2020-02-27 MED ORDER — ONDANSETRON HCL 4 MG/2ML IJ SOLN
4.0000 mg | Freq: Four times a day (QID) | INTRAMUSCULAR | Status: DC | PRN
  Administered 2020-03-02: 4 mg via INTRAVENOUS
  Filled 2020-02-27: qty 2

## 2020-02-27 MED ORDER — LACTATED RINGERS IV BOLUS
1500.0000 mL | Freq: Once | INTRAVENOUS | Status: AC
  Administered 2020-02-27: 1500 mL via INTRAVENOUS

## 2020-02-27 MED ORDER — VANCOMYCIN HCL IN DEXTROSE 1-5 GM/200ML-% IV SOLN: 1000.0000 mg | Freq: Once | INTRAVENOUS | Status: DC

## 2020-02-27 MED ORDER — METRONIDAZOLE IN NACL 5-0.79 MG/ML-% IV SOLN
500.0000 mg | Freq: Three times a day (TID) | INTRAVENOUS | Status: DC
  Administered 2020-02-27 – 2020-03-02 (×11): 500 mg via INTRAVENOUS
  Filled 2020-02-27 (×12): qty 100

## 2020-02-27 MED ORDER — SODIUM CHLORIDE 0.9 % IV SOLN
INTRAVENOUS | Status: DC | PRN
  Administered 2020-02-27: 250 mL via INTRAVENOUS

## 2020-02-27 MED ORDER — ACETAMINOPHEN 325 MG PO TABS
650.0000 mg | ORAL_TABLET | Freq: Four times a day (QID) | ORAL | Status: DC | PRN
  Administered 2020-02-28: 650 mg via ORAL
  Filled 2020-02-27: qty 2

## 2020-02-27 MED ORDER — SODIUM CHLORIDE 0.9 % IV SOLN: 2.0000 g | Freq: Three times a day (TID) | INTRAVENOUS | Status: DC

## 2020-02-27 NOTE — ED Notes (Signed)
Attempted to call report, no answer. Will call back.

## 2020-02-27 NOTE — ED Provider Notes (Signed)
4:10 PM Care assumed with Dr. Vallery Ridge.  At time of transfer care, patient is awaiting admission for infection of currently undetermined etiology with soft blood pressures, tachycardia, fevers, and tremors.  Patient received broad-spectrum antibiotics and is getting more fluids now that blood pressure was dropping into the low 90s.  Patient is also hyponatremic. We are waiting on a CT abdomen pelvis given the elevated LFTs prior to admission.  Anticipate admission after work-up is completed.  5:51 PM CT of the chest left abdomen/pelvis is returned and appears to show evidence of colitis.  This is likely contributing to his symptoms and work-up results.  Patient's blood pressure did drop into the 80s and he is getting full 30 cc/kg of fluids for sepsis now.  ESR and CRP are also slightly elevated likely related to the infection.  Urinalysis does not show convincing evidence of UTI without nitrites or leukocytes.  Lactic acid was initially elevated but is downtrending after the fluids.  Chest x-ray did not show pneumonia and CT scan also did not show pneumonia.  He is Covid negative.  Due to the soft pressures, hyponatremia, and colitis, will call for admission.  Family also reports has not been eating or drinking the last few days and thinks this could have contributed to his symptoms.  Medicine team called for admission.     Clinical Impression: 1. Hypokalemia   2. Colitis   3. Fever, unspecified fever cause   4. Hypotension, unspecified hypotension type   5. Dehydration     Disposition: Admit  This note was prepared with assistance of Dragon voice recognition software. Occasional wrong-word or sound-a-like substitutions may have occurred due to the inherent limitations of voice recognition software.     Rebacca Votaw, Gwenyth Allegra, MD 02/27/20 2120

## 2020-02-27 NOTE — Progress Notes (Signed)
Pharmacy Antibiotic Note  Christopher Valencia is a 44 y.o. male admitted on 02/27/2020 with sepsis.  Pharmacy has been consulted for vancomycin and cefepime dosing.  Tmax 100.1. WBC 7.9. Lactic acid 2.5. HR 100s, SBP 90-100. Kidney function most likely below baseline, SCr 1.1, CrCl ~89. Unsure typical kidney function but last SCr 0.7. Of note patient is paraplegic. Rare bacteria in UA.   Estimated AUC ~417.   Plan: Vancomycin 1,750mg  IV x1 Vancomycin 1,000mg  IV every 12 hours.  Goal trough 15-20 mcg/mL.  Cefepime 2g IV every 8 hours Flagyl per MD Follow up with cultures, antibiotic de-escalation and LOT Monitor renal function, clinical progress, and vanc trough if necessary    Temp (24hrs), Avg:100.1 F (37.8 C), Min:100.1 F (37.8 C), Max:100.1 F (37.8 C)  Recent Labs  Lab 02/27/20 1155 02/27/20 1200  WBC  --  7.9  CREATININE  --  1.11  LATICACIDVEN 2.5*  --     Estimated Creatinine Clearance: 89.5 mL/min (by C-G formula based on SCr of 1.11 mg/dL).    No Known Allergies  Antimicrobials this admission: Vancomycin 9/21 >>  Cefepime 9/21 >>  Metronidazole 9/21 >>  Dose adjustments this admission: N/a  Microbiology results: 9/21 BCx: pending 9/21 UCx: pending   Thank you for allowing pharmacy to be a part of this patient's care.  Yvetta Coder, PharmD PGY1 Acute Care Pharmacy Resident Please refer to [facility] for unit-specific pharmacist

## 2020-02-27 NOTE — Progress Notes (Signed)
   02/27/20 2226  Assess: MEWS Score  Temp 97.9 F (36.6 C)  BP (!) 80/58  ECG Heart Rate 94  Resp 15  SpO2 98 %  O2 Device Room Air  Assess: MEWS Score  MEWS Temp 0  MEWS Systolic 2  MEWS Pulse 0  MEWS RR 0  MEWS LOC 0  MEWS Score 2  MEWS Score Color Yellow  Assess: if the MEWS score is Yellow or Red  Were vital signs taken at a resting state? Yes  Focused Assessment No change from prior assessment  Early Detection of Sepsis Score *See Row Information* High  MEWS guidelines implemented *See Row Information* No, previously yellow, continue vital signs every 4 hours  Treat  Pain Score 0  Escalate  MEWS: Escalate Yellow: discuss with charge nurse/RN and consider discussing with provider and RRT  Notify: Charge Nurse/RN  Name of Charge Nurse/RN Notified Alcario Drought RN  Date Charge Nurse/RN Notified 02/27/20  Time Charge Nurse/RN Notified 2300  Document  Patient Outcome Stabilized after interventions  Progress note created (see row info) Yes   BP 117/80 at this time. No complains. Call bell within reach. CN aware. Will monitor.

## 2020-02-27 NOTE — ED Triage Notes (Signed)
Pt bib PTAR w/ c/o fever and tremors. Pt has hx of cervical injury and is paraplegic. Pt seen her recently for the same. EMS VSS.

## 2020-02-27 NOTE — Telephone Encounter (Signed)
I called pt's mother (he's too sick to speak with) and gave them my contact info to have ER call me if any questions- to help guide diagnosis.  Thank you for letting me know

## 2020-02-27 NOTE — ED Notes (Signed)
Pt given ED happy meal per RN, [employer].

## 2020-02-27 NOTE — Telephone Encounter (Addendum)
Christopher Valencia's mother-not documented in list of contacts has called about Christopher Valencia being sick since 9/10.  She took him to the [facility] ED on [address] on 9/10 and 9/11 and was treated for muscle spasms though he developed a fever of 102.  He was tested for covid and was negative and checked for pneumonia and UTI all negative. He was to follow up with his PCP which he did and now has an order for an ultrasound to r/o DVT but that is not until Thursday.  She is asking to speak with Dr Berline Chough. Christopher Valencia is still spiking fevers. I suggested her call the PCP but she said they have been seen by him. She is asking if she should have him transported back to the ED.  I have advised her that Dr Berline Chough is out of the office today but will be back tomorrow.  If she feels his condition is deteriorating and/or having difficulty breathing, then yes I would suggest she take him back. Otherwise, Dr Berline Chough will not be available to address her call until tomorrow 02/28/20. (Dr Wynn Banker who has seen him is out of the office as well) She understands.  She is just concerned because he is a SCI that Dr Berline Chough will know more what might be going on.  I will forward to her inbox to be addressed tomorrow. Christopher Valencia's mothers phne number is [phone].

## 2020-02-27 NOTE — ED Provider Notes (Signed)
[name] EMERGENCY DEPARTMENT Provider Note   CSN: [id number] Arrival date & time: 02/27/20  1117     History Chief Complaint  Patient presents with  . Fever    Christopher Valencia is a 44 y.o. male.  HPI Patient has paraplegia from distant spinal cord injury.  He has developed tremors and fever over the past 2 weeks.  Initially, this was thought to be muscle spasms and musculoskeletal back pain treated with Flexeril after ED visit 9\10.  Patient reports symptoms did not improve and he has continued to have tremors.  He has also developed a fever as high as 102.  He does not have any focal symptoms.  He does however note that he had a lot of back pain in his lower central back last night.  He took tramadol.  He takes tramadol as needed.  He reports sometimes he may does take 1 or 2 up to as many as 6/day if needed.  He has not been taking high doses recently.  He has been taking acetaminophen with some relief.  He has not noted pain or burning with urination.  He has had decreased appetite.  He does not perceive abdominal pain, chest pain or shortness of breath.  No headache no confusion.  I did review the case with patient's primary provider for spinal care.  Dr. Gwenlyn Perking, she advises acute exacerbation of tremors and spinal cord patient is are often associated with occult infection.  She suggests work-up for occult infection and CT scanning chest abdomen pelvis for source.    Past Medical History:  Diagnosis Date  . Paraplegia following spinal cord injury (HCC)   . Spinal cord injury, C5-C7 [facility]     Patient Active Problem List   Diagnosis Date Noted  . Wheelchair dependence 09/13/2019  . Neurogenic bowel 06/11/2015  . Neurogenic bladder 06/11/2015  . Chronic spastic tetraplegia (HCC) 09/24/2014  . Injury at C6 level of cervical spinal cord (HCC) 01/01/2012    Past Surgical History:  Procedure Laterality Date  . ABDOMINAL SURGERY    . BACK SURGERY          No family history on file.  Social History   Tobacco Use  . Smoking status: Never Smoker  . Smokeless tobacco: Never Used  Substance Use Topics  . Alcohol use: No  . Drug use: No    Home Medications Prior to Admission medications   Medication Sig Start Date End Date Taking? Authorizing Provider  cephALEXin (KEFLEX) 500 MG capsule Take 1 capsule (500 mg total) by mouth 4 (four) times daily. 02/17/20   Melene Plan, DO  cyclobenzaprine (FLEXERIL) 10 MG tablet Take 1 tablet (10 mg total) by mouth 3 (three) times daily as needed for muscle spasms. 02/16/20   Placido Sou, PA-C  traMADol (ULTRAM) 50 MG tablet Take 1 tablet (50 mg total) by mouth every 6 (six) hours as needed. 09/13/19   Genice Rouge, MD    Allergies    Patient has no known allergies.  Review of Systems   Review of Systems 10 systems reviewed and negative except as per HPI Physical Exam Updated Vital Signs BP 102/83   Pulse (!) 109   Temp 100.1 F (37.8 C) (Oral)   Resp 13   SpO2 96%   Physical Exam Constitutional:      Comments: Patient is alert and nontoxic.  No respiratory distress.  Mental status clear.  HENT:     Head: Normocephalic and atraumatic.  Mouth/Throat:     Pharynx: Oropharynx is clear.  Eyes:     Extraocular Movements: Extraocular movements intact.  Cardiovascular:     Pulses: Normal pulses.     Comments: Borderline tachycardia.  Regular no gross rub murmur gallop Pulmonary:     Effort: Pulmonary effort is normal.     Breath sounds: Normal breath sounds.  Abdominal:     Comments: Abdomen is soft.  Nondistended nontender.  No guarding.  Genitourinary:    Penis: Normal.      Comments: Penis and scrotum without edema or rash.  Perianal area normal without swelling, hemorrhoids or rashes.  No skin breakdown of the buttocks. Musculoskeletal:     Cervical back: Neck supple.     Comments: Examination of the back is normal without soft tissue breakdown.  Patient does have some  focus of pain of the very lower back down towards the sacrum and coccyx.  Lower extremities have about 1+ edema bilateral and symmetric.  Feet and lower legs are in very good condition without any wounds.  Feet are warm and dry.  Skin:    General: Skin is warm and dry.     Comments: Patient has exfoliative rash of the face.  No other body rash.  Neurological:     Comments: Patient is alert with clear mental status.  He has some use of upper extremities to try to help with repositioning.  But has paralysis of the lower extremities.  Significant weakness of the upper extremities at baseline.  Psychiatric:        Mood and Affect: Mood normal.     ED Results / Procedures / Treatments   Labs (all labs ordered are listed, but only abnormal results are displayed) Labs Reviewed  CULTURE, BLOOD (ROUTINE X 2)  CULTURE, BLOOD (ROUTINE X 2)  SARS CORONAVIRUS 2 BY RT PCR (HOSPITAL ORDER, PERFORMED IN [facility])  COMPREHENSIVE METABOLIC PANEL  LACTIC ACID, PLASMA  LACTIC ACID, PLASMA  CBC WITH DIFFERENTIAL/PLATELET  PROTIME-INR  URINALYSIS, ROUTINE W REFLEX MICROSCOPIC  SEDIMENTATION RATE  C-REACTIVE PROTEIN    EKG EKG Interpretation  Date/Time:  Tuesday February 27 2020 12:30:34 EDT Ventricular Rate:  112 PR Interval:    QRS Duration: 93 QT Interval:  379 QTC Calculation: 518 R Axis:   48 Text Interpretation: Sinus tachycardia Probable left atrial enlargement RSR' in V1 or V2, right VCD or RVH ST elevation, consider anterior injury Prolonged QT interval Baseline wander in lead(s) V3 when compared to prior, faster rate and longer QTc. No STEMI Confirmed by Theda Belfast ([id number]) on 02/27/2020 5:12:53 PM   Radiology DG Chest Port 1 View  Result Date: 02/27/2020 CLINICAL DATA:  fever and tremors. Pt has hx of cervical injury and is paraplegic. Back pain,weak EXAM: PORTABLE CHEST - 1 VIEW COMPARISON:  02/17/2020 and previous FINDINGS: Lungs are clear. Heart size and  mediastinal contours are within normal limits. No effusion.  No pneumothorax. Cervical fixation hardware partially visualized. IMPRESSION: No acute cardiopulmonary disease. Electronically Signed   By: Corlis Leak M.D.   On: 02/27/2020 12:02    Procedures Procedures (including critical care time) CRITICAL CARE Performed by: Arby Barrette   Total critical care time: 30 minutes  Critical care time was exclusive of separately billable procedures and treating other patients.  Critical care was necessary to treat or prevent imminent or life-threatening deterioration.  Critical care was time spent personally by me on the following activities: development of treatment plan with patient and/or surrogate  as well as nursing, discussions with consultants, evaluation of patient's response to treatment, examination of patient, obtaining history from patient or surrogate, ordering and performing treatments and interventions, ordering and review of laboratory studies, ordering and review of radiographic studies, pulse oximetry and re-evaluation of patient's condition. Medications Ordered in ED Medications  diazepam (VALIUM) injection 2.5 mg (has no administration in time range)  lactated ringers bolus 500 mL (has no administration in time range)    ED Course  I have reviewed the triage vital signs and the nursing notes.  Pertinent labs & imaging results that were available during my care of the patient were reviewed by me and considered in my medical decision making (see chart for details).    MDM Rules/Calculators/A&P                         Patient with history of distant spinal cord injury and paraplegia.  Recent medical course and includes fevers and tremors.  Symptoms persisting and worsening.  Case reviewed with the patient's treating physician and concern for indolent infection\sepsis.  Diagnostic evaluation and broad-spectrum antibiotics with fluid resuscitation initiated.  Dr. Julieanne Manson to  follow-up on diagnostic results of CT scan and admit for final disposition. Final Clinical Impression(s) / ED Diagnoses Final diagnoses:  Hypokalemia  Colitis  Fever, unspecified fever cause  Hypotension, unspecified hypotension type  Dehydration    Rx / DC Orders ED Discharge Orders    None       Arby Barrette, MD 03/10/20 1203

## 2020-02-27 NOTE — ED Notes (Signed)
Pt to CT scan via stretcher by transport. 

## 2020-02-27 NOTE — H&P (Addendum)
History and Physical    IBAN UTZ BJS:[id number] DOB: May 21, 1976 DOA: 02/27/2020  PCP: Jilda Panda, MD  Patient coming from: Home I have personally briefly reviewed patient's old medical records in [facility]  Chief Complaint: Shaking of body since 10 days  HPI: Christopher Valencia is a 44 y.o. male with medical history significant of paraplegia due to spinal cord injury, neurogenic bladder/bowel, wheelchair bound, chronic constipation presents to emergency department with shaking of body.  Patient tells me that he has shaking all over his body since 10 days.  Reports intermittent fever-highest fever noted to be 102.3 since 10 days.  Initially he started having some spasm which she thought could be a pulled muscle.  He went to [facility] on 9/10 and was prescribed muscle relaxant.  He went to ED on 9/11 with fever and shaking of his body.  Work-up including UA, chest x-ray came back negative for acute findings therefore he discharged home in stable condition and was prescribed Keflex.  Patient reports decreased appetite however denies any headache, blurry vision, lightheadedness, dizziness, seizure-like activity, loss of consciousness, head trauma, chest pain, shortness of breath, palpitation, nausea, vomiting, watery diarrhea, abdominal pain, urinary changes.  He uses suppository early in the morning every day to help with bowel movement.  He lives with his parents at home.  No history of smoking, alcohol, listed drug use.  He is fully vaccinated against COVID-19.  Patient tells me that his blood pressure usually runs on the lower side and 100s over 70s at home.  ED Course: Upon arrival to ED: Patient had fever of 100.1, tachycardic, lactic acid: 2.5, blood pressure noted to be low 80s/50s.  CRP: 1.3, ESR: 37, COVID-19 negative, UA positive for rare bacteria.  Platelet: 443, AST: 113, ALT: 57, sodium: 127, chest x-ray negative for acute findings.  CT abdomen/pelvis and CT chest was  obtained which came back positive for colitis.  Patient received IV fluid boluses, Vanco, cefepime and Flagyl in ED.  [facility] hospital[facility] consulted for admission.  Review of Systems: As per HPI otherwise negative.    Past Medical History:  Diagnosis Date  . Paraplegia following spinal cord injury ([place])   . Spinal cord injury, C5-C7 [facility]     Past Surgical History:  Procedure Laterality Date  . ABDOMINAL SURGERY    . BACK SURGERY       reports that he has never smoked. He has never used smokeless tobacco. He reports that he does not drink alcohol and does not use drugs.  No Known Allergies  History reviewed. No pertinent family history.  Prior to Admission medications   Medication Sig Start Date End Date Taking? Authorizing Provider  acetaminophen (TYLENOL) 500 MG tablet Take 500 mg by mouth every 6 (six) hours as needed for mild pain.   Yes [provider]  cephALEXin (KEFLEX) 500 MG capsule Take 1 capsule (500 mg total) by mouth 4 (four) times daily. 02/17/20  Yes Deno Etienne, DO  cyclobenzaprine (FLEXERIL) 10 MG tablet Take 1 tablet (10 mg total) by mouth 3 (three) times daily as needed for muscle spasms. 02/16/20  Yes Rayna Sexton, PA-C  traMADol (ULTRAM) 50 MG tablet Take 1 tablet (50 mg total) by mouth every 6 (six) hours as needed. Patient taking differently: Take 50 mg by mouth every 6 (six) hours as needed for moderate pain.  09/13/19  Yes Courtney Heys, MD    Physical Exam: Vitals:   02/27/20 1745 02/27/20 1800 02/27/20  1815 02/27/20 1819  BP: (!) 117/98 132/86 (!) 144/104   Pulse: 99 100 96   Resp: _0 Temp:    (!) 97.3 F (36.3 C)  TempSrc:    Oral  SpO2: 99% 97% 100%     Constitutional: NAD, calm, comfortable, on room air, communicating well. Eyes: PERRL, lids and conjunctivae normal ENMT: Mucous membranes are moist. Posterior pharynx clear of any exudate or lesions.Normal dentition.  Neck: normal, supple, no masses, no thyromegaly Respiratory:  clear to auscultation bilaterally, no wheezing, no crackles. Normal respiratory effort. No accessory muscle use.  Cardiovascular: Tachycardic, no murmurs / rubs / gallops. No extremity edema. 2+ pedal pulses. No carotid bruits.  Abdomen: no tenderness, no masses palpated. No hepatosplenomegaly. Bowel sounds positive.  Musculoskeletal: no clubbing / cyanosis. No joint deformity upper and lower extremities. Good ROM, no contractures. Normal muscle tone.  Skin: no rashes, lesions, ulcers. No induration Neurologic: Paraplegic.   Psychiatric: Normal judgment and insight. Alert and oriented x 3. Normal mood.    Labs on Admission: I have personally reviewed following labs and imaging studies  CBC: Recent Labs  Lab 02/27/20 1200  WBC 7.9  NEUTROABS 5.7  HGB 12.0*  HCT 36.6*  MCV 82.8  PLT 250*   Basic Metabolic Panel: Recent Labs  Lab 02/27/20 1200 02/27/20 1744  NA 127* 129*  K 3.7 3.8  CL 94* 94*  CO2 24 24  GLUCOSE 100* 85  BUN 8 8  CREATININE 1.11 0.96  CALCIUM 8.0* 7.8*   GFR: Estimated Creatinine Clearance: 103.5 mL/min (by C-G formula based on SCr of 0.96 mg/dL). Liver Function Tests: Recent Labs  Lab 02/27/20 1200  AST 113*  ALT 57*  ALKPHOS 32*  BILITOT 0.6  PROT 6.0*  ALBUMIN 3.0*   Recent Labs  Lab 02/27/20 1744  LIPASE 45   No results for input(s): AMMONIA in the last 168 hours. Coagulation Profile: Recent Labs  Lab 02/27/20 1200  INR 1.1   Cardiac Enzymes: No results for input(s): CKTOTAL, CKMB, CKMBINDEX, TROPONINI in the last 168 hours. BNP (last 3 results) No results for input(s): PROBNP in the last 8760 hours. HbA1C: No results for input(s): HGBA1C in the last 72 hours. CBG: No results for input(s): GLUCAP in the last 168 hours. Lipid Profile: No results for input(s): CHOL, HDL, LDLCALC, TRIG, CHOLHDL, LDLDIRECT in the last 72 hours. Thyroid Function Tests: No results for input(s): TSH, T4TOTAL, FREET4, T3FREE, THYROIDAB in the last 72  hours. Anemia Panel: No results for input(s): VITAMINB12, FOLATE, FERRITIN, TIBC, IRON, RETICCTPCT in the last 72 hours. Urine analysis:    Component Value Date/Time   COLORURINE YELLOW 02/27/2020 1257   APPEARANCEUR HAZY (A) 02/27/2020 1257   LABSPEC 1.008 02/27/2020 1257   PHURINE 6.0 02/27/2020 1257   GLUCOSEU NEGATIVE 02/27/2020 1257   HGBUR MODERATE (A) 02/27/2020 1257   BILIRUBINUR NEGATIVE 02/27/2020 1257   KETONESUR NEGATIVE 02/27/2020 1257   PROTEINUR NEGATIVE 02/27/2020 1257   UROBILINOGEN 0.2 03/13/2013 1448   NITRITE NEGATIVE 02/27/2020 1257   LEUKOCYTESUR NEGATIVE 02/27/2020 1257    Radiological Exams on Admission: CT Chest W Contrast  Result Date: 02/27/2020 CLINICAL DATA:  Status post trauma. EXAM: CT CHEST, ABDOMEN, AND PELVIS WITH CONTRAST TECHNIQUE: Multidetector CT imaging of the chest, abdomen and pelvis was performed following the standard protocol during bolus administration of intravenous contrast. CONTRAST:  149m OMNIPAQUE IOHEXOL 300 MG/ML  SOLN COMPARISON:  None. FINDINGS: CT CHEST FINDINGS Cardiovascular: No significant vascular findings. Normal  heart size. No pericardial effusion. Mediastinum/Nodes: No enlarged mediastinal, hilar, or axillary lymph nodes. Thyroid gland, trachea, and esophagus demonstrate no significant findings. Lungs/Pleura: Very mild scarring and/or atelectasis is seen within the posteromedial aspect of the left lung base. There is no evidence of a pleural effusion or pneumothorax. Musculoskeletal: No chest wall mass or suspicious bone lesions identified. CT ABDOMEN PELVIS FINDINGS Hepatobiliary: No focal liver abnormality is seen. No gallstones, gallbladder wall thickening, or biliary dilatation. Pancreas: Unremarkable. No pancreatic ductal dilatation or surrounding inflammatory changes. Spleen: Normal in size without focal abnormality. Adrenals/Urinary Tract: Adrenal glands are unremarkable. Kidneys are normal, without renal calculi, focal  lesion, or hydronephrosis. Bladder is unremarkable. Stomach/Bowel: Stomach is within normal limits. Appendix appears normal. No evidence of bowel dilatation. Mild thickening of the mid to distal sigmoid colon is seen. Vascular/Lymphatic: No significant vascular findings are present. No enlarged abdominal or pelvic lymph nodes. Reproductive: The prostate gland is mildly enlarged. Other: No abdominal wall hernia or abnormality. No abdominopelvic ascites. Musculoskeletal: No acute or significant osseous findings. IMPRESSION: 1. No evidence of acute or active cardiopulmonary disease. 2. Mild thickening of the mid to distal sigmoid colon which may represent sequelae associated with mild colitis. Electronically Signed   By: Virgina Norfolk M.D.   On: 02/27/2020 17:30   CT Abdomen Pelvis W Contrast  Result Date: 02/27/2020 CLINICAL DATA:  Abdominal pain and fever. EXAM: CT CHEST, ABDOMEN, AND PELVIS WITH CONTRAST TECHNIQUE: Multidetector CT imaging of the chest, abdomen and pelvis was performed following the standard protocol during bolus administration of intravenous contrast. CONTRAST:  122m OMNIPAQUE IOHEXOL 300 MG/ML  SOLN COMPARISON:  None. FINDINGS: CT CHEST FINDINGS Cardiovascular: No significant vascular findings. Normal heart size. No pericardial effusion. Mediastinum/Nodes: No enlarged mediastinal, hilar, or axillary lymph nodes. Thyroid gland, trachea, and esophagus demonstrate no significant findings. Lungs/Pleura: Very mild scarring and/or atelectasis is seen within the posteromedial aspect of the left lung base. There is no evidence of a pleural effusion or pneumothorax. Musculoskeletal: No chest wall mass or suspicious bone lesions identified. CT ABDOMEN PELVIS FINDINGS Hepatobiliary: No focal liver abnormality is seen. No gallstones, gallbladder wall thickening, or biliary dilatation. Pancreas: Unremarkable. No pancreatic ductal dilatation or surrounding inflammatory changes. Spleen: Normal in size  without focal abnormality. Adrenals/Urinary Tract: Adrenal glands are unremarkable. Kidneys are normal, without renal calculi, focal lesion, or hydronephrosis. Bladder is unremarkable. Stomach/Bowel: Stomach is within normal limits. Appendix appears normal. No evidence of bowel dilatation. Mild thickening of the mid to distal sigmoid colon is seen. Vascular/Lymphatic: No significant vascular findings are present. No enlarged abdominal or pelvic lymph nodes. Reproductive: The prostate gland is mildly enlarged. Other: No abdominal wall hernia or abnormality. No abdominopelvic ascites. Musculoskeletal: No acute or significant osseous findings. IMPRESSION: 1. No evidence of acute or active cardiopulmonary disease. 2. Mild thickening of the mid to distal sigmoid colon which may represent sequelae associated with mild colitis. Electronically Signed   By: TVirgina NorfolkM.D.   On: 02/27/2020 16:44   DG Chest Port 1 View  Result Date: 02/27/2020 CLINICAL DATA:  fever and tremors. Pt has hx of cervical injury and is paraplegic. Back pain,weak EXAM: PORTABLE CHEST - 1 VIEW COMPARISON:  02/17/2020 and previous FINDINGS: Lungs are clear. Heart size and mediastinal contours are within normal limits. No effusion.  No pneumothorax. Cervical fixation hardware partially visualized. IMPRESSION: No acute cardiopulmonary disease. Electronically Signed   By: DLucrezia EuropeM.D.   On: 02/27/2020 12:02    EKG: Independently reviewed.  Sinus tachycardia, prolonged QT interval.  Assessment/Plan Principal Problem:   Colitis Active Problems:   Neurogenic bowel   Neurogenic bladder   Paraplegia following spinal cord injury ([place])   Hyponatremia   Elevated liver enzymes   Mild Colitis: -Patient presented with  tachycardia, lactic acid of 2.5 and hypotension-he does not meet sepsis criteria. -COVID-19 negative.  Chest x-ray negative.  Urine negative for infection.  Lipase: WNL.  CT abdomen/pelvis shows colitis.  Patient denies  abdominal pain, N/V/D. -Received IV fluid bolus, Vanco, cefepime and Flagyl in ED. -Admit patient stepdown unit for close monitoring.  Will give another dose of IV fluid bolus since blood pressure is still on the lower side.  -Start on Rocephin and Flagyl.  Blood culture, procalcitonin level: Pending -Trend lactic acid.  Monitor vitals closely. -Zofran as needed for nausea and vomiting.  Tylenol/oxycodone as needed for fever and pain respectively.  Shaking of body/tremors/chills: Improved -Could be related to electrolyte abnormalities-hyponatremia/hypochloremia, hypocalcemia. -Check TSH, magnesium, phosphorus  Elevated liver enzymes: AST: 113, ALT: 57 -PT/INR: WNL.   -Check acute hepatitis panel, ammonia level -Repeat CMP tomorrow a.m.  Hyponatremia: Sodium of 127 -Continue IV fluids.  Check serum osmolality, urine sodium, TSH -Repeat BMP tomorrow a.m.  History of paraplegia following spinal cord injury: Neurogenic bladder/bowel: Patient denies any urinary symptoms or any bowel changes.  DVT prophylaxis: Lovenox/SCD  code Status: Full code Family Communication: *Patient's parents present at bedside.  Plan of care discussed with patient in length and he verbalized understanding and agreed with it. Disposition Plan: Home in 2 to 3 days  consults called: None Admission status: Inpatient  Mckinley Jewel MD [facility]  If 7PM-7AM, please contact night-coverage [email or web address] Password [facility]  02/27/2020, 7:04 PM

## 2020-02-28 DIAGNOSIS — E860 Dehydration: Secondary | ICD-10-CM

## 2020-02-28 DIAGNOSIS — E871 Hypo-osmolality and hyponatremia: Secondary | ICD-10-CM

## 2020-02-28 DIAGNOSIS — E876 Hypokalemia: Secondary | ICD-10-CM

## 2020-02-28 DIAGNOSIS — G8220 Paraplegia, unspecified: Secondary | ICD-10-CM

## 2020-02-28 DIAGNOSIS — R509 Fever, unspecified: Secondary | ICD-10-CM

## 2020-02-28 DIAGNOSIS — K592 Neurogenic bowel, not elsewhere classified: Secondary | ICD-10-CM

## 2020-02-28 DIAGNOSIS — N319 Neuromuscular dysfunction of bladder, unspecified: Secondary | ICD-10-CM

## 2020-02-28 DIAGNOSIS — I959 Hypotension, unspecified: Secondary | ICD-10-CM

## 2020-02-28 DIAGNOSIS — R748 Abnormal levels of other serum enzymes: Secondary | ICD-10-CM

## 2020-02-28 LAB — URINE CULTURE: Culture: NO GROWTH

## 2020-02-28 LAB — PROTIME-INR
INR: 1.1 (ref 0.8–1.2)
Prothrombin Time: 13.7 seconds (ref 11.4–15.2)

## 2020-02-28 LAB — PROCALCITONIN: Procalcitonin: <0.1 ng/mL

## 2020-02-28 LAB — COMPREHENSIVE METABOLIC PANEL
ALT: 52 U/L — ABNORMAL HIGH (ref 0–44)
AST: 92 U/L — ABNORMAL HIGH (ref 15–41)
Albumin: 2.9 g/dL — ABNORMAL LOW (ref 3.5–5.0)
Alkaline Phosphatase: 31 U/L — ABNORMAL LOW (ref 38–126)
Anion gap: 12 (ref 5–15)
BUN: 8 mg/dL (ref 6–20)
CO2: 23 mmol/L (ref 22–32)
Calcium: 7.8 mg/dL — ABNORMAL LOW (ref 8.9–10.3)
Chloride: 96 mmol/L — ABNORMAL LOW (ref 98–111)
Creatinine, Ser: 0.88 mg/dL (ref 0.61–1.24)
GFR calc Af Amer: >60 mL/min (ref >60)
GFR calc non Af Amer: >60 mL/min (ref >60)
Glucose, Bld: 88 mg/dL (ref 70–99)
Potassium: 3.9 mmol/L (ref 3.5–5.1)
Sodium: 131 mmol/L — ABNORMAL LOW (ref 135–145)
Total Bilirubin: 0.5 mg/dL (ref 0.3–1.2)
Total Protein: 5.8 g/dL — ABNORMAL LOW (ref 6.5–8.1)

## 2020-02-28 LAB — CORTISOL-AM, BLOOD: Cortisol - AM: 9.9 ug/dL (ref 6.7–22.6)

## 2020-02-28 MED ORDER — LORAZEPAM 2 MG/ML IJ SOLN
1.0000 mg | Freq: Once | INTRAMUSCULAR | Status: AC
  Administered 2020-02-28: 1 mg via INTRAVENOUS
  Filled 2020-02-28: qty 1

## 2020-02-28 MED ORDER — BACLOFEN 10 MG PO TABS
10.0000 mg | ORAL_TABLET | Freq: Three times a day (TID) | ORAL | Status: DC
  Administered 2020-02-28 – 2020-03-06 (×23): 10 mg via ORAL
  Filled 2020-02-28 (×23): qty 1

## 2020-02-28 MED ORDER — BISACODYL 10 MG RE SUPP
10.0000 mg | Freq: Every day | RECTAL | Status: DC
  Administered 2020-02-29 – 2020-03-06 (×6): 10 mg via RECTAL
  Filled 2020-02-28 (×7): qty 1

## 2020-02-28 NOTE — Progress Notes (Addendum)
PROGRESS NOTE  Christopher Valencia:[id number] DOB: Mar 10, 1976 DOA: 02/27/2020 PCP: Jilda Panda, MD   LOS: 1 day   Brief narrative: As per HPI,  Christopher Valencia is a 44 y.o. male with medical history significant of paraplegia due to spinal cord injury, neurogenic bladder/bowel, wheelchair bound, chronic constipation presented to emergency department with shaking of body for 10 days with intermittent fever highest up to 1002.3 F..  There was some concern for pulled muscle in the back so he went to [facility] where he was prescribed a muscle relaxant.  He went to ED on 9/11 with fever and shaking of his body.  Work-up including UA, chest x-ray came back negative for acute findings therefore he discharged home in stable condition and was prescribed Keflex. He lives with his parents at home.he again presented to hospital this time since he was not getting better.Upon arrival to ED: Patient had fever of 100.1, tachycardic, lactic acid: 2.5, blood pressure noted to be low 80s/50s.  Normally runs on the lower side at home. CRP: 1.3, ESR: 37, COVID-19 negative, UA positive for rare bacteria.  Platelet: 443, AST: 113, ALT: 57, sodium: 127, chest x-ray negative for acute findings.  CT abdomen/pelvis and CT chest was obtained which came back positive for colitis.  Patient received IV fluid boluses, Vanco, cefepime and Flagyl in ED.  [facility] consulted for admission.  Assessment/Plan:  Principal Problem:   Colitis Active Problems:   Neurogenic bowel   Neurogenic bladder   Paraplegia following spinal cord injury ([place])   Hyponatremia   Elevated liver enzymes  Mild Colitis: Sepsis has been ruled out.  COVID-19 negative.  Chest x-ray negative.  CT chest was negative for acute findings. Urine negative for infection.  Lipase: Within normal limits..  Lactate was initially elevated which has improved at this time. CT abdomen/pelvis shows colitis.  No active nausea vomiting or diarrhea. Received  IV fluid bolus, Vanco, cefepime and Flagyl in ED. Patient was started on Rocephin and Flagyl.  We will continue with that for now. Blood cultures negative in 1 day.  Cortisol a.m. of 9.9. Hepatitis panel negative.  HIV negative.  Mild tachycardia noted.  Shaking of body/tremors/chills: Ongoing for the last 10 days.  Mostly of the lower extremities.  Started after twisting the muscle in the back.  Possibly secondary to infection, hponatremia/hypochloremia, hypocalcemia. Phosphorus was 2.9.  Magnesium normal limits. TSH within normal limits.  We will add baclofen.  Received 1 dose of Ativan with mild relief.  Procalcitonin less than 0.10.  Hepatitis panel was negative.  Elevated liver enzymes: AST: 113, ALT: 57 .INR within normal limits.  Hepatitis panel was negative.  Repeat CMP from today pending.  Hyponatremia: Sodium of 127 on presentation.  Continue to monitor.  BMP from today shows sodium of 131.  Serum osmolality was 277.  History of paraplegia following spinal cord injury: Neurogenic bladder/bowel: Unchanged.  DVT prophylaxis: enoxaparin (LOVENOX) injection 40 mg Start: 02/27/20 1845 SCDs Start: 02/27/20 1842   Code Status: Full code  Family Communication: I spoke with the patient's mother on the phone and updated her about the clinical condition of the patient.   Status is: Inpatient  Remains inpatient appropriate because:IV treatments appropriate due to intensity of illness or inability to take PO and Inpatient level of care appropriate due to severity of illness   Dispo: The patient is from: Home              Anticipated d/c is  to: Home              Anticipated d/c date is: 2 days, patient lives with his parents.              Patient currently is not medically stable to d/c.  Consultants:  None  Procedures:  None  Antibiotics:  . Rocephin and metronidazole  Anti-infectives (From admission, onward)   Start     Dose/Rate Route Frequency Ordered Stop   02/28/20 0500   vancomycin (VANCOCIN) IVPB 1000 mg/200 mL premix  Status:  Discontinued        1,000 mg 200 mL/hr over 60 Minutes Intravenous Every 12 hours 02/27/20 1551 02/27/20 1932   02/28/20 0400  vancomycin (VANCOCIN) IVPB 1000 mg/200 mL premix  Status:  Discontinued        1,000 mg 200 mL/hr over 60 Minutes Intravenous Every 12 hours 02/27/20 1532 02/27/20 1551   02/28/20 0000  ceFEPIme (MAXIPIME) 2 g in sodium chloride 0.9 % 100 mL IVPB  Status:  Discontinued        2 g 200 mL/hr over 30 Minutes Intravenous Every 8 hours 02/27/20 1517 02/27/20 1932   02/28/20 0000  metroNIDAZOLE (FLAGYL) IVPB 500 mg        500 mg 100 mL/hr over 60 Minutes Intravenous Every 8 hours 02/27/20 1843     02/27/20 2000  cefTRIAXone (ROCEPHIN) 2 g in sodium chloride 0.9 % 100 mL IVPB        2 g 200 mL/hr over 30 Minutes Intravenous Every 24 hours 02/27/20 1843     02/27/20 1530  vancomycin (VANCOREADY) IVPB 1750 mg/350 mL        1,750 mg 175 mL/hr over 120 Minutes Intravenous  Once 02/27/20 1516 02/27/20 1928   02/27/20 1515  ceFEPIme (MAXIPIME) 2 g in sodium chloride 0.9 % 100 mL IVPB        2 g 200 mL/hr over 30 Minutes Intravenous  Once 02/27/20 1509 02/27/20 1653   02/27/20 1515  metroNIDAZOLE (FLAGYL) IVPB 500 mg        500 mg 100 mL/hr over 60 Minutes Intravenous  Once 02/27/20 1509 02/27/20 1659   02/27/20 1515  vancomycin (VANCOCIN) IVPB 1000 mg/200 mL premix  Status:  Discontinued        1,000 mg 200 mL/hr over 60 Minutes Intravenous  Once 02/27/20 1509 02/27/20 1516     Subjective: Today, patient was seen and examined at bedside.  Patient complains of mild shaking of his lower extremities.  No fever chills or rigor.  No shortness of breath, nausea, vomiting or abdominal pain.  Objective: Vitals:   02/28/20 0925 02/28/20 1144  BP: 109/85 (!) 132/91  Pulse: (!) 110   Resp: 16 18  Temp:  98.9 F (37.2 C)  SpO2: 99%     Intake/Output Summary (Last 24 hours) at 02/28/2020 1257 Last data filed at  02/28/2020 1146 Gross per 24 hour  Intake 6736.22 ml  Output 2200 ml  Net 4536.22 ml   Filed Weights   02/27/20 2226 02/28/20 0400  Weight: 89.5 kg 92.2 kg   Body mass index is 29.17 kg/m.   Physical Exam: GENERAL: Patient is alert awake and oriented. Not in obvious distress. HENT: No scleral pallor or icterus. Pupils equally reactive to light. Oral mucosa is moist NECK: is supple, no gross swelling noted. CHEST: Clear to auscultation. No crackles or wheezes.  Diminished breath sounds bilaterally. CVS: S1 and S2 heard, no murmur. Regular rate and rhythm.  External urinary catheter in place. ABDOMEN: Soft, non-tender, bowel sounds are present. EXTREMITIES: No edema.  Paraplegia noted.  Mild tremors noted of the bilateral lower extremities. CNS: Cranial nerves are intact. No focal motor deficits. SKIN: warm and dry without rashes.  Data Review: I have personally reviewed the following laboratory data and studies,  CBC: Recent Labs  Lab 02/27/20 1200  WBC 7.9  NEUTROABS 5.7  HGB 12.0*  HCT 36.6*  MCV 82.8  PLT 176*   Basic Metabolic Panel: Recent Labs  Lab 02/27/20 1200 02/27/20 1744 02/27/20 1841  NA 127* 129*  --   K 3.7 3.8  --   CL 94* 94*  --   CO2 24 24  --   GLUCOSE 100* 85  --   BUN 8 8  --   CREATININE 1.11 0.96  --   CALCIUM 8.0* 7.8*  --   MG  --   --  2.1  PHOS  --   --  2.9   Liver Function Tests: Recent Labs  Lab 02/27/20 1200  AST 113*  ALT 57*  ALKPHOS 32*  BILITOT 0.6  PROT 6.0*  ALBUMIN 3.0*   Recent Labs  Lab 02/27/20 1744  LIPASE 45   Recent Labs  Lab 02/27/20 1925  AMMONIA 20   Cardiac Enzymes: No results for input(s): CKTOTAL, CKMB, CKMBINDEX, TROPONINI in the last 168 hours. BNP (last 3 results) No results for input(s): BNP in the last 8760 hours.  ProBNP (last 3 results) No results for input(s): PROBNP in the last 8760 hours.  CBG: No results for input(s): GLUCAP in the last 168 hours. Recent Results (from the  past 240 hour(s))  Culture, blood (routine x 2)     Status: None (Preliminary result)   Collection Time: 02/27/20 11:36 AM   Specimen: BLOOD  Result Value Ref Range Status   Specimen Description BLOOD BLOOD LEFT HAND  Final   Special Requests   Final    BOTTLES DRAWN AEROBIC AND ANAEROBIC Blood Culture adequate volume   Culture   Final    NO GROWTH 1 DAY Performed at [facility], [address], [place], [place] [address]    Report Status PENDING  Incomplete  Culture, blood (routine x 2)     Status: None (Preliminary result)   Collection Time: 02/27/20 12:29 PM   Specimen: BLOOD  Result Value Ref Range Status   Specimen Description BLOOD BLOOD RIGHT HAND  Final   Special Requests   Final    BOTTLES DRAWN AEROBIC AND ANAEROBIC Blood Culture results may not be optimal due to an excessive volume of blood received in culture bottles   Culture   Final    NO GROWTH 1 DAY Performed at [facility], [place] [address] [place], [place] [address]    Report Status PENDING  Incomplete  SARS Coronavirus 2 by RT PCR (hospital order, performed in [facility] hospital lab) Nasopharyngeal Nasopharyngeal Swab     Status: None   Collection Time: 02/27/20 12:52 PM   Specimen: Nasopharyngeal Swab  Result Value Ref Range Status   SARS Coronavirus 2 NEGATIVE NEGATIVE Final    Comment: (NOTE) SARS-CoV-2 target nucleic acids are NOT DETECTED.  The SARS-CoV-2 RNA is generally detectable in upper and lower respiratory specimens during the acute phase of infection. The lowest concentration of SARS-CoV-2 viral copies this assay can detect is 250 copies / mL. A negative result does not preclude SARS-CoV-2 infection and should not be used as  the sole basis for treatment or other patient management decisions.  A negative result may occur with improper specimen collection / handling, submission of specimen other than nasopharyngeal swab, presence of viral mutation(s) within the areas targeted by  this assay, and inadequate number of viral copies (<250 copies / mL). A negative result must be combined with clinical observations, patient history, and epidemiological information.  Fact Sheet for Patients:   StrictlyIdeas.no  Fact Sheet for Healthcare Providers: BankingDealers.co.za  This test is not yet approved or  cleared by the [place] FDA and has been authorized for detection and/or diagnosis of SARS-CoV-2 by FDA under an Emergency Use Authorization (EUA).  This EUA will remain in effect (meaning this test can be used) for the duration of the COVID-19 declaration under Section 564(b)(1) of the Act, 21 U.S.C. section 360bbb-3(b)(1), unless the authorization is terminated or revoked sooner.  Performed at [facility], [place] [address], [place], [place] [address]      Studies: CT Chest W Contrast  Result Date: 02/27/2020 CLINICAL DATA:  Status post trauma. EXAM: CT CHEST, ABDOMEN, AND PELVIS WITH CONTRAST TECHNIQUE: Multidetector CT imaging of the chest, abdomen and pelvis was performed following the standard protocol during bolus administration of intravenous contrast. CONTRAST:  110m OMNIPAQUE IOHEXOL 300 MG/ML  SOLN COMPARISON:  None. FINDINGS: CT CHEST FINDINGS Cardiovascular: No significant vascular findings. Normal heart size. No pericardial effusion. Mediastinum/Nodes: No enlarged mediastinal, hilar, or axillary lymph nodes. Thyroid gland, trachea, and esophagus demonstrate no significant findings. Lungs/Pleura: Very mild scarring and/or atelectasis is seen within the posteromedial aspect of the left lung base. There is no evidence of a pleural effusion or pneumothorax. Musculoskeletal: No chest wall mass or suspicious bone lesions identified. CT ABDOMEN PELVIS FINDINGS Hepatobiliary: No focal liver abnormality is seen. No gallstones, gallbladder wall thickening, or biliary dilatation. Pancreas: Unremarkable. No pancreatic  ductal dilatation or surrounding inflammatory changes. Spleen: Normal in size without focal abnormality. Adrenals/Urinary Tract: Adrenal glands are unremarkable. Kidneys are normal, without renal calculi, focal lesion, or hydronephrosis. Bladder is unremarkable. Stomach/Bowel: Stomach is within normal limits. Appendix appears normal. No evidence of bowel dilatation. Mild thickening of the mid to distal sigmoid colon is seen. Vascular/Lymphatic: No significant vascular findings are present. No enlarged abdominal or pelvic lymph nodes. Reproductive: The prostate gland is mildly enlarged. Other: No abdominal wall hernia or abnormality. No abdominopelvic ascites. Musculoskeletal: No acute or significant osseous findings. IMPRESSION: 1. No evidence of acute or active cardiopulmonary disease. 2. Mild thickening of the mid to distal sigmoid colon which may represent sequelae associated with mild colitis. Electronically Signed   By: TVirgina NorfolkM.D.   On: 02/27/2020 17:30   CT Abdomen Pelvis W Contrast  Result Date: 02/27/2020 CLINICAL DATA:  Abdominal pain and fever. EXAM: CT CHEST, ABDOMEN, AND PELVIS WITH CONTRAST TECHNIQUE: Multidetector CT imaging of the chest, abdomen and pelvis was performed following the standard protocol during bolus administration of intravenous contrast. CONTRAST:  1070mOMNIPAQUE IOHEXOL 300 MG/ML  SOLN COMPARISON:  None. FINDINGS: CT CHEST FINDINGS Cardiovascular: No significant vascular findings. Normal heart size. No pericardial effusion. Mediastinum/Nodes: No enlarged mediastinal, hilar, or axillary lymph nodes. Thyroid gland, trachea, and esophagus demonstrate no significant findings. Lungs/Pleura: Very mild scarring and/or atelectasis is seen within the posteromedial aspect of the left lung base. There is no evidence of a pleural effusion or pneumothorax. Musculoskeletal: No chest wall mass or suspicious bone lesions identified. CT ABDOMEN PELVIS FINDINGS Hepatobiliary: No focal  liver abnormality is seen.  No gallstones, gallbladder wall thickening, or biliary dilatation. Pancreas: Unremarkable. No pancreatic ductal dilatation or surrounding inflammatory changes. Spleen: Normal in size without focal abnormality. Adrenals/Urinary Tract: Adrenal glands are unremarkable. Kidneys are normal, without renal calculi, focal lesion, or hydronephrosis. Bladder is unremarkable. Stomach/Bowel: Stomach is within normal limits. Appendix appears normal. No evidence of bowel dilatation. Mild thickening of the mid to distal sigmoid colon is seen. Vascular/Lymphatic: No significant vascular findings are present. No enlarged abdominal or pelvic lymph nodes. Reproductive: The prostate gland is mildly enlarged. Other: No abdominal wall hernia or abnormality. No abdominopelvic ascites. Musculoskeletal: No acute or significant osseous findings. IMPRESSION: 1. No evidence of acute or active cardiopulmonary disease. 2. Mild thickening of the mid to distal sigmoid colon which may represent sequelae associated with mild colitis. Electronically Signed   By: Virgina Norfolk M.D.   On: 02/27/2020 16:44   DG Chest Port 1 View  Result Date: 02/27/2020 CLINICAL DATA:  fever and tremors. Pt has hx of cervical injury and is paraplegic. Back pain,weak EXAM: PORTABLE CHEST - 1 VIEW COMPARISON:  02/17/2020 and previous FINDINGS: Lungs are clear. Heart size and mediastinal contours are within normal limits. No effusion.  No pneumothorax. Cervical fixation hardware partially visualized. IMPRESSION: No acute cardiopulmonary disease. Electronically Signed   By: Lucrezia Europe M.D.   On: 02/27/2020 12:02     Flora Lipps, MD  [facility] 02/28/2020

## 2020-02-28 NOTE — Progress Notes (Signed)
Pt HR 120-130's, upon entry to the room, pt body is shaking, complaining of back discomfort. shifted the patient's position in the bed per pt request. Pt still shaking. day RN aware.

## 2020-02-28 NOTE — Progress Notes (Signed)
   02/28/20 0800  Assess: MEWS Score  Temp 98.9 F (37.2 C)  BP 138/87  Pulse Rate (!) 132  ECG Heart Rate (!) 130  Resp 19  SpO2 97 %  Assess: MEWS Score  MEWS Temp 0  MEWS Systolic 0  MEWS Pulse 3  MEWS RR 0  MEWS LOC 0  MEWS Score 3  MEWS Score Color Yellow  Assess: if the MEWS score is Yellow or Red  Were vital signs taken at a resting state? Yes  Focused Assessment No change from prior assessment  Early Detection of Sepsis Score *See Row Information* High  MEWS guidelines implemented *See Row Information* No, previously yellow, continue vital signs every 4 hours  Treat  MEWS Interventions Escalated (See documentation below)  Pain Scale 0-10  Pain Score 0  Take Vital Signs  Increase Vital Sign Frequency  Yellow: Q 2hr X 2 then Q 4hr X 2, if remains yellow, continue Q 4hrs  Escalate  MEWS: Escalate Yellow: discuss with charge nurse/RN and consider discussing with provider and RRT  Notify: Charge Nurse/RN  Name of Charge Nurse/RN Notified Barbie RN  Date Charge Nurse/RN Notified 02/28/20  Time Charge Nurse/RN Notified 0830  Notify: Provider  Provider Name/Title Pokhrel MD  Date Provider Notified 02/28/20  Time Provider Notified 0800  Notification Type Page  Notification Reason Change in status  Response See new orders  Date of Provider Response 02/28/20  Time of Provider Response 0800  Notify: Rapid Response  Name of Rapid Response RN Notified N/a  Document  Patient Outcome Not stable and remains on department  Progress note created (see row info) Yes

## 2020-02-29 ENCOUNTER — Inpatient Hospital Stay (HOSPITAL_COMMUNITY): Payer: Medicare Other

## 2020-02-29 DIAGNOSIS — E872 Acidosis: Secondary | ICD-10-CM

## 2020-02-29 LAB — BASIC METABOLIC PANEL
Anion gap: 11 (ref 5–15)
BUN: 7 mg/dL (ref 6–20)
CO2: 23 mmol/L (ref 22–32)
Calcium: 8.1 mg/dL — ABNORMAL LOW (ref 8.9–10.3)
Chloride: 99 mmol/L (ref 98–111)
Creatinine, Ser: 1.05 mg/dL (ref 0.61–1.24)
GFR calc Af Amer: >60 mL/min (ref >60)
GFR calc non Af Amer: >60 mL/min (ref >60)
Glucose, Bld: 102 mg/dL — ABNORMAL HIGH (ref 70–99)
Potassium: 3.9 mmol/L (ref 3.5–5.1)
Sodium: 133 mmol/L — ABNORMAL LOW (ref 135–145)

## 2020-02-29 LAB — CBC
HCT: 38 % — ABNORMAL LOW (ref 39.0–52.0)
Hemoglobin: 12.3 g/dL — ABNORMAL LOW (ref 13.0–17.0)
MCH: 27 pg (ref 26.0–34.0)
MCHC: 32.4 g/dL (ref 30.0–36.0)
MCV: 83.3 fL (ref 80.0–100.0)
Platelets: 488 10*3/uL — ABNORMAL HIGH (ref 150–400)
RBC: 4.56 MIL/uL (ref 4.22–5.81)
RDW: 15.4 % (ref 11.5–15.5)
WBC: 7.6 10*3/uL (ref 4.0–10.5)
nRBC: 0 % (ref 0.0–0.2)

## 2020-02-29 LAB — LACTIC ACID, PLASMA
Lactic Acid, Venous: 4.1 mmol/L (ref 0.5–1.9)
Lactic Acid, Venous: 2.5 mmol/L (ref 0.5–1.9)

## 2020-02-29 LAB — CK: Total CK: 2573 U/L — ABNORMAL HIGH (ref 49–397)

## 2020-02-29 MED ORDER — SODIUM CHLORIDE 0.9 % IV SOLN: INTRAVENOUS | Status: AC

## 2020-02-29 MED ORDER — GADOBUTROL 1 MMOL/ML IV SOLN
10.0000 mL | Freq: Once | INTRAVENOUS | Status: AC | PRN
  Administered 2020-02-29: 10 mL via INTRAVENOUS

## 2020-02-29 NOTE — Progress Notes (Addendum)
PROGRESS NOTE  Christopher Valencia OEV:[id number] DOB: [birth year] DOA: 02/27/2020 PCP: Jilda Panda, MD   LOS: 2 days   Brief narrative: As per HPI,  [name] is a 44 y.o. male with medical history significant of paraplegia due to spinal cord injury, neurogenic bladder/bowel, wheelchair bound, chronic constipation presented to emergency department with shaking of body for 10 days with intermittent fever highest up to 1002.3 F..  There was some concern for pulled muscle in the back so he went to [facility] where he was prescribed a muscle relaxant.  He went to ED on 9/11 with fever and shaking of his body.  Work-up including UA, chest x-ray came back negative for acute findings therefore he discharged home in stable condition and was prescribed Keflex. He lives with his parents at home.he again presented to hospital this time since he was not getting better.Upon arrival to ED: Patient had fever of 100.1, tachycardic, lactic acid: 2.5, blood pressure noted to be low 80s/50s.  Normally runs on the lower side at home. CRP: 1.3, ESR: 37, COVID-19 negative, UA positive for rare bacteria.  Platelet: 443, AST: 113, ALT: 57, sodium: 127, chest x-ray negative for acute findings.  CT abdomen/pelvis and CT chest was obtained which came back positive for colitis.  Patient received IV fluid boluses, Vanco, cefepime and Flagyl in ED.  [facility] consulted for admission.  Assessment/Plan:  Principal Problem:   Colitis Active Problems:   Neurogenic bowel   Neurogenic bladder   Paraplegia following spinal cord injury ([place])   Hyponatremia   Elevated liver enzymes  Mild Colitis:  COVID-19 negative.  Chest x-ray negative.  CT chest was negative for acute findings. Urine negative for infection.  Lipase: Within normal limits..  Lactate was initially elevated which improved and has trended up again to 4.0 today.  CT abdomen/pelvis shows colitis.  No active nausea vomiting or diarrhea. Received IV  fluid bolus, Vanco, cefepime and Flagyl in ED. Patient was then started on Rocephin and Flagyl.  We will continue with that for now. Blood cultures negative in 1 day.  Cortisol a.m. of 9.9. Hepatitis panel negative.  HIV negative.  Mild tachycardia noted.  Shaking of body/tremors/chills: Ongoing for the last 10 days.  Mostly of the lower extremities.  Started after twisting the muscle in the back.  Possibly secondary to infection, hponatremia/hypochloremia, hypocalcemia. Phosphorus was 2.9.  Magnesium normal limits. TSH within normal limits.  Added to baclofen yesterday with mild relief.   Procalcitonin less than 0.10 but lactate has gone up to 4.0 today..  Hepatitis panel was negative.  Patient complains of lower back pain with elevated ESR and ongoing fever with T-max of 100.10F yesterday.  We will do MRI of the lumbar spine to rule out vertebral osteomyelitis epidural infection.  Spoke with neurology.  It is common to have tremors and involuntary movement  with spinal cord injury and paraplegia.  We will continue baclofen for now.  Recommended MRI for back evaluation.  Elevated liver enzymes: AST: 113, ALT: 57 .INR within normal limits.  Hepatitis panel was negative.  Check LFTs in a.m.   Hyponatremia: Sodium of 127 on presentation.  Continue to monitor.  BMP from today shows sodium of 133.  Serum osmolality was 277.  History of paraplegia following spinal cord injury: Neurogenic bladder/bowel: Unchanged.  DVT prophylaxis: enoxaparin (LOVENOX) injection 40 mg Start: 02/27/20 1845 SCDs Start: 02/27/20 1842   Code Status: Full code  Family Communication: None today.  I  spoke with the patient's mother on the phone and updated her about the clinical condition of the patient.   Status is: Inpatient  Remains inpatient appropriate because:IV treatments appropriate due to intensity of illness or inability to take PO and Inpatient level of care appropriate due to severity of illness, lactic acidosis,  persistent fever, need for further work-up.   Dispo: The patient is from: Home              Anticipated d/c is to: Home              Anticipated d/c date is: 2 days, patient lives with his parents.              Patient currently is not medically stable to d/c.  Consultants:  Spoke with neurology probably regarding tremors.  Procedures:  None  Antibiotics:  . Rocephin and metronidazole  Anti-infectives (From admission, onward)   Start     Dose/Rate Route Frequency Ordered Stop   02/28/20 0500  vancomycin (VANCOCIN) IVPB 1000 mg/200 mL premix  Status:  Discontinued        1,000 mg 200 mL/hr over 60 Minutes Intravenous Every 12 hours 02/27/20 1551 02/27/20 1932   02/28/20 0400  vancomycin (VANCOCIN) IVPB 1000 mg/200 mL premix  Status:  Discontinued        1,000 mg 200 mL/hr over 60 Minutes Intravenous Every 12 hours 02/27/20 1532 02/27/20 1551   02/28/20 0000  ceFEPIme (MAXIPIME) 2 g in sodium chloride 0.9 % 100 mL IVPB  Status:  Discontinued        2 g 200 mL/hr over 30 Minutes Intravenous Every 8 hours 02/27/20 1517 02/27/20 1932   02/28/20 0000  metroNIDAZOLE (FLAGYL) IVPB 500 mg        500 mg 100 mL/hr over 60 Minutes Intravenous Every 8 hours 02/27/20 1843     02/27/20 2000  cefTRIAXone (ROCEPHIN) 2 g in sodium chloride 0.9 % 100 mL IVPB        2 g 200 mL/hr over 30 Minutes Intravenous Every 24 hours 02/27/20 1843     02/27/20 1530  vancomycin (VANCOREADY) IVPB 1750 mg/350 mL        1,750 mg 175 mL/hr over 120 Minutes Intravenous  Once 02/27/20 1516 02/27/20 1928   02/27/20 1515  ceFEPIme (MAXIPIME) 2 g in sodium chloride 0.9 % 100 mL IVPB        2 g 200 mL/hr over 30 Minutes Intravenous  Once 02/27/20 1509 02/27/20 1653   02/27/20 1515  metroNIDAZOLE (FLAGYL) IVPB 500 mg        500 mg 100 mL/hr over 60 Minutes Intravenous  Once 02/27/20 1509 02/27/20 1659   02/27/20 1515  vancomycin (VANCOCIN) IVPB 1000 mg/200 mL premix  Status:  Discontinued        1,000 mg 200  mL/hr over 60 Minutes Intravenous  Once 02/27/20 1509 02/27/20 1516     Subjective: Today, patient was seen and examined at bedside.  Patient complains of mild shaking of his lower extremities.  No fever, chills or rigor.  No shortness of breath, nausea, vomiting or abdominal pain.  Objective: Vitals:   02/29/20 0754 02/29/20 0800  BP:  (!) 157/101  Pulse:    Resp:  19  Temp: 99.9 F (37.7 C)   SpO2:  100%    Intake/Output Summary (Last 24 hours) at 02/29/2020 1121 Last data filed at 02/29/2020 0859 Gross per 24 hour  Intake 1920 ml  Output 4700 ml  Net -  2780 ml   Filed Weights   02/27/20 2226 02/28/20 0400 02/29/20 0352  Weight: 89.5 kg 92.2 kg 90.1 kg   Body mass index is 28.5 kg/m.   Physical Exam: GENERAL: Patient is alert awake and oriented. Not in obvious distress. HENT: No scleral pallor or icterus. Pupils equally reactive to light. Oral mucosa is moist NECK: is supple, no gross swelling noted. CHEST: Clear to auscultation. No crackles or wheezes.  Diminished breath sounds bilaterally. CVS: S1 and S2 heard, no murmur. Regular rate and rhythm.  External urinary catheter in place. ABDOMEN: Soft, non-tender, bowel sounds are present. EXTREMITIES: No edema.  Paraplegia noted.  Mild tremors noted of the bilateral lower extremities. CNS: Cranial nerves are intact.  Paraplegia noted. SKIN: warm and dry without rashes.  Data Review: I have personally reviewed the following laboratory data and studies,  CBC: Recent Labs  Lab 02/27/20 1200 02/29/20 0950  WBC 7.9 7.6  NEUTROABS 5.7  --   HGB 12.0* 12.3*  HCT 36.6* 38.0*  MCV 82.8 83.3  PLT 443* 132*   Basic Metabolic Panel: Recent Labs  Lab 02/27/20 1200 02/27/20 1744 02/27/20 1841 02/28/20 1151  NA 127* 129*  --  131*  K 3.7 3.8  --  3.9  CL 94* 94*  --  96*  CO2 24 24  --  23  GLUCOSE 100* 85  --  88  BUN 8 8  --  8  CREATININE 1.11 0.96  --  0.88  CALCIUM 8.0* 7.8*  --  7.8*  MG  --   --  2.1  --     PHOS  --   --  2.9  --    Liver Function Tests: Recent Labs  Lab 02/27/20 1200 02/28/20 1151  AST 113* 92*  ALT 57* 52*  ALKPHOS 32* 31*  BILITOT 0.6 0.5  PROT 6.0* 5.8*  ALBUMIN 3.0* 2.9*   Recent Labs  Lab 02/27/20 1744  LIPASE 45   Recent Labs  Lab 02/27/20 1925  AMMONIA 20   Cardiac Enzymes: No results for input(s): CKTOTAL, CKMB, CKMBINDEX, TROPONINI in the last 168 hours. BNP (last 3 results) No results for input(s): BNP in the last 8760 hours.  ProBNP (last 3 results) No results for input(s): PROBNP in the last 8760 hours.  CBG: No results for input(s): GLUCAP in the last 168 hours. Recent Results (from the past 240 hour(s))  Culture, blood (routine x 2)     Status: None (Preliminary result)   Collection Time: 02/27/20 11:36 AM   Specimen: BLOOD  Result Value Ref Range Status   Specimen Description BLOOD BLOOD LEFT HAND  Final   Special Requests   Final    BOTTLES DRAWN AEROBIC AND ANAEROBIC Blood Culture adequate volume   Culture   Final    NO GROWTH 1 DAY Performed at [facility], [address] [place], [place] [address]    Report Status PENDING  Incomplete  Culture, blood (routine x 2)     Status: None (Preliminary result)   Collection Time: 02/27/20 12:29 PM   Specimen: BLOOD  Result Value Ref Range Status   Specimen Description BLOOD BLOOD RIGHT HAND  Final   Special Requests   Final    BOTTLES DRAWN AEROBIC AND ANAEROBIC Blood Culture results may not be optimal due to an excessive volume of blood received in culture bottles   Culture   Final    NO GROWTH 1 DAY Performed at [facility],  [address], [place], [place] [address]    Report Status PENDING  Incomplete  SARS Coronavirus 2 by RT PCR (hospital order, performed in [facility] hospital lab) Nasopharyngeal Nasopharyngeal Swab     Status: None   Collection Time: 02/27/20 12:52 PM   Specimen: Nasopharyngeal Swab  Result Value Ref Range Status   SARS Coronavirus 2  NEGATIVE NEGATIVE Final    Comment: (NOTE) SARS-CoV-2 target nucleic acids are NOT DETECTED.  The SARS-CoV-2 RNA is generally detectable in upper and lower respiratory specimens during the acute phase of infection. The lowest concentration of SARS-CoV-2 viral copies this assay can detect is 250 copies / mL. A negative result does not preclude SARS-CoV-2 infection and should not be used as the sole basis for treatment or other patient management decisions.  A negative result may occur with improper specimen collection / handling, submission of specimen other than nasopharyngeal swab, presence of viral mutation(s) within the areas targeted by this assay, and inadequate number of viral copies (<250 copies / mL). A negative result must be combined with clinical observations, patient history, and epidemiological information.  Fact Sheet for Patients:   StrictlyIdeas.no  Fact Sheet for Healthcare Providers: BankingDealers.co.za  This test is not yet approved or  cleared by the [place] FDA and has been authorized for detection and/or diagnosis of SARS-CoV-2 by FDA under an Emergency Use Authorization (EUA).  This EUA will remain in effect (meaning this test can be used) for the duration of the COVID-19 declaration under Section 564(b)(1) of the Act, 21 U.S.C. section 360bbb-3(b)(1), unless the authorization is terminated or revoked sooner.  Performed at [facility], [place] [address] [place], [place] [address]   Urine culture     Status: None   Collection Time: 02/27/20  1:25 PM   Specimen: Urine, Random  Result Value Ref Range Status   Specimen Description URINE, RANDOM  Final   Special Requests NONE  Final   Culture   Final    NO GROWTH Performed at [facility], [place] [address] [place], [place] [address]    Report Status 02/28/2020 FINAL  Final     Studies: CT Chest W Contrast  Result Date: 02/27/2020 CLINICAL  DATA:  Status post trauma. EXAM: CT CHEST, ABDOMEN, AND PELVIS WITH CONTRAST TECHNIQUE: Multidetector CT imaging of the chest, abdomen and pelvis was performed following the standard protocol during bolus administration of intravenous contrast. CONTRAST:  15m OMNIPAQUE IOHEXOL 300 MG/ML  SOLN COMPARISON:  None. FINDINGS: CT CHEST FINDINGS Cardiovascular: No significant vascular findings. Normal heart size. No pericardial effusion. Mediastinum/Nodes: No enlarged mediastinal, hilar, or axillary lymph nodes. Thyroid gland, trachea, and esophagus demonstrate no significant findings. Lungs/Pleura: Very mild scarring and/or atelectasis is seen within the posteromedial aspect of the left lung base. There is no evidence of a pleural effusion or pneumothorax. Musculoskeletal: No chest wall mass or suspicious bone lesions identified. CT ABDOMEN PELVIS FINDINGS Hepatobiliary: No focal liver abnormality is seen. No gallstones, gallbladder wall thickening, or biliary dilatation. Pancreas: Unremarkable. No pancreatic ductal dilatation or surrounding inflammatory changes. Spleen: Normal in size without focal abnormality. Adrenals/Urinary Tract: Adrenal glands are unremarkable. Kidneys are normal, without renal calculi, focal lesion, or hydronephrosis. Bladder is unremarkable. Stomach/Bowel: Stomach is within normal limits. Appendix appears normal. No evidence of bowel dilatation. Mild thickening of the mid to distal sigmoid colon is seen. Vascular/Lymphatic: No significant vascular findings are present. No enlarged abdominal or pelvic lymph nodes. Reproductive: The prostate gland is mildly  enlarged. Other: No abdominal wall hernia or abnormality. No abdominopelvic ascites. Musculoskeletal: No acute or significant osseous findings. IMPRESSION: 1. No evidence of acute or active cardiopulmonary disease. 2. Mild thickening of the mid to distal sigmoid colon which may represent sequelae associated with mild colitis. Electronically  Signed   By: Virgina Norfolk M.D.   On: 02/27/2020 17:30   CT Abdomen Pelvis W Contrast  Result Date: 02/27/2020 CLINICAL DATA:  Abdominal pain and fever. EXAM: CT CHEST, ABDOMEN, AND PELVIS WITH CONTRAST TECHNIQUE: Multidetector CT imaging of the chest, abdomen and pelvis was performed following the standard protocol during bolus administration of intravenous contrast. CONTRAST:  124m OMNIPAQUE IOHEXOL 300 MG/ML  SOLN COMPARISON:  None. FINDINGS: CT CHEST FINDINGS Cardiovascular: No significant vascular findings. Normal heart size. No pericardial effusion. Mediastinum/Nodes: No enlarged mediastinal, hilar, or axillary lymph nodes. Thyroid gland, trachea, and esophagus demonstrate no significant findings. Lungs/Pleura: Very mild scarring and/or atelectasis is seen within the posteromedial aspect of the left lung base. There is no evidence of a pleural effusion or pneumothorax. Musculoskeletal: No chest wall mass or suspicious bone lesions identified. CT ABDOMEN PELVIS FINDINGS Hepatobiliary: No focal liver abnormality is seen. No gallstones, gallbladder wall thickening, or biliary dilatation. Pancreas: Unremarkable. No pancreatic ductal dilatation or surrounding inflammatory changes. Spleen: Normal in size without focal abnormality. Adrenals/Urinary Tract: Adrenal glands are unremarkable. Kidneys are normal, without renal calculi, focal lesion, or hydronephrosis. Bladder is unremarkable. Stomach/Bowel: Stomach is within normal limits. Appendix appears normal. No evidence of bowel dilatation. Mild thickening of the mid to distal sigmoid colon is seen. Vascular/Lymphatic: No significant vascular findings are present. No enlarged abdominal or pelvic lymph nodes. Reproductive: The prostate gland is mildly enlarged. Other: No abdominal wall hernia or abnormality. No abdominopelvic ascites. Musculoskeletal: No acute or significant osseous findings. IMPRESSION: 1. No evidence of acute or active cardiopulmonary  disease. 2. Mild thickening of the mid to distal sigmoid colon which may represent sequelae associated with mild colitis. Electronically Signed   By: TVirgina NorfolkM.D.   On: 02/27/2020 16:44   DG Chest Port 1 View  Result Date: 02/27/2020 CLINICAL DATA:  fever and tremors. Pt has hx of cervical injury and is paraplegic. Back pain,weak EXAM: PORTABLE CHEST - 1 VIEW COMPARISON:  02/17/2020 and previous FINDINGS: Lungs are clear. Heart size and mediastinal contours are within normal limits. No effusion.  No pneumothorax. Cervical fixation hardware partially visualized. IMPRESSION: No acute cardiopulmonary disease. Electronically Signed   By: DLucrezia EuropeM.D.   On: 02/27/2020 12:02     LFlora Lipps MD  [facility] 02/29/2020

## 2020-03-01 LAB — CBC
HCT: 37.7 % — ABNORMAL LOW (ref 39.0–52.0)
Hemoglobin: 12 g/dL — ABNORMAL LOW (ref 13.0–17.0)
MCH: 27.1 pg (ref 26.0–34.0)
MCHC: 31.8 g/dL (ref 30.0–36.0)
MCV: 85.1 fL (ref 80.0–100.0)
Platelets: 417 10*3/uL — ABNORMAL HIGH (ref 150–400)
RBC: 4.43 MIL/uL (ref 4.22–5.81)
RDW: 16 % — ABNORMAL HIGH (ref 11.5–15.5)
WBC: 6.5 10*3/uL (ref 4.0–10.5)
nRBC: 0 % (ref 0.0–0.2)

## 2020-03-01 LAB — COMPREHENSIVE METABOLIC PANEL
ALT: 116 U/L — ABNORMAL HIGH (ref 0–44)
AST: 145 U/L — ABNORMAL HIGH (ref 15–41)
Albumin: 2.7 g/dL — ABNORMAL LOW (ref 3.5–5.0)
Alkaline Phosphatase: 36 U/L — ABNORMAL LOW (ref 38–126)
Anion gap: 10 (ref 5–15)
BUN: 6 mg/dL (ref 6–20)
CO2: 22 mmol/L (ref 22–32)
Calcium: 7.7 mg/dL — ABNORMAL LOW (ref 8.9–10.3)
Chloride: 104 mmol/L (ref 98–111)
Creatinine, Ser: 0.74 mg/dL (ref 0.61–1.24)
GFR calc Af Amer: >60 mL/min (ref >60)
GFR calc non Af Amer: >60 mL/min (ref >60)
Glucose, Bld: 116 mg/dL — ABNORMAL HIGH (ref 70–99)
Potassium: 3.8 mmol/L (ref 3.5–5.1)
Sodium: 136 mmol/L (ref 135–145)
Total Bilirubin: 0.6 mg/dL (ref 0.3–1.2)
Total Protein: 5.5 g/dL — ABNORMAL LOW (ref 6.5–8.1)

## 2020-03-01 LAB — MAGNESIUM: Magnesium: 2 mg/dL (ref 1.7–2.4)

## 2020-03-01 LAB — LACTIC ACID, PLASMA: Lactic Acid, Venous: 2.3 mmol/L (ref 0.5–1.9)

## 2020-03-01 LAB — CK: Total CK: 2446 U/L — ABNORMAL HIGH (ref 49–397)

## 2020-03-01 LAB — PHOSPHORUS: Phosphorus: 3.1 mg/dL (ref 2.5–4.6)

## 2020-03-01 MED ORDER — LIP MEDEX EX OINT
TOPICAL_OINTMENT | CUTANEOUS | Status: DC | PRN
  Filled 2020-03-01: qty 7

## 2020-03-01 MED ORDER — SODIUM CHLORIDE 0.9 % IV BOLUS
250.0000 mL | Freq: Once | INTRAVENOUS | Status: AC
  Administered 2020-03-01: 250 mL via INTRAVENOUS

## 2020-03-01 NOTE — Plan of Care (Signed)

## 2020-03-01 NOTE — Progress Notes (Signed)
PROGRESS NOTE  Christopher Valencia SWF:[id number] DOB: 1975/09/13 DOA: 02/27/2020 PCP: Jilda Panda, MD   LOS: 3 days   Brief narrative: As per HPI,  Christopher Valencia is a 44 y.o. male with medical history significant of paraplegia due to spinal cord injury, neurogenic bladder/bowel, wheelchair bound, chronic constipation presented to emergency department with shaking of body for 10 days with intermittent fever highest up to 1002.3 F..  There was some concern for pulled muscle in the back so he went to [facility] med center where he was prescribed a muscle relaxant.  He went to ED on 9/11 with fever and shaking of his body.  Work-up including UA, chest x-ray came back negative for acute findings therefore he discharged home in stable condition and was prescribed Keflex. He lives with his parents at home.he again presented to hospital this time since he was not getting better.Upon arrival to ED: Patient had fever of 100.1, tachycardic, lactic acid: 2.5, blood pressure noted to be low 80s/50s.  Normally runs on the lower side at home. CRP: 1.3, ESR: 37, COVID-19 negative, UA positive for rare bacteria.  Platelet: 443, AST: 113, ALT: 57, sodium: 127, chest x-ray negative for acute findings.  CT abdomen/pelvis and CT chest was obtained which came back positive for colitis.  Patient received IV fluid boluses, Vanco, cefepime and Flagyl in ED.  [facility] hospital[facility] consulted for admission.  Assessment/Plan:  Principal Problem:   Colitis Active Problems:   Neurogenic bowel   Neurogenic bladder   Paraplegia following spinal cord injury ([place])   Hyponatremia   Elevated liver enzymes  Mild Colitis: Currently on Rocephin and Flagyl.  We will continue to complete course.  Patient did have spikes of fever but no other obvious source of infection noted so far.    Mild rhabdomyolysis.  CK level is elevated at 2500.  Renal function is stable so far.  Patient is bedbound and has myositis on the the MRI scan.  There is a  possibility of rhabdomyolysis contributing to elevated LFT.  We will continue with IV fluids and monitor urine output.  Shaking of body/tremors: Common in patients with paraplegia and could be exacerbated by infection.  Improved with baclofen.  Patient did have back pain and MRI was performed since he had fever but only myositis was identified.  Fever chills with elevated lactate.  Negative prolactin but elevated lactate.  Received IV fluids.  No other source of infection so far except for colitis and mild myositis.  Mild elevated ESR.  T-max of 99.9 at this time.  Urinalysis was negative.  CT chest was negative for acute findings.  CT abdomen/pelvis showed colitis.  Blood cultures negative so far.  Hepatitis panel negative.  HIV negative.    Elevated liver enzymes: AST: 113, ALT: 57 on presentation.  INR within normal limits.  Hepatitis panel was negative.  Possibly from rhabdomyolysis.   Hyponatremia: Sodium of 127 on presentation.  Has improved to 136 today.  History of paraplegia following spinal cord injury: Neurogenic bladder/bowel: Unchanged.  DVT prophylaxis: enoxaparin (LOVENOX) injection 40 mg Start: 02/27/20 1845 SCDs Start: 02/27/20 1842   Code Status: Full code  Family Communication: None today.   Status is: Inpatient  Remains inpatient appropriate because:IV treatments appropriate due to intensity of illness or inability to take PO and Inpatient level of care appropriate due to severity of illness, lactic acidosis, fever, elevated LFT.   Dispo: The patient is from: Home  Anticipated d/c is to: Home              Anticipated d/c date is: 2 days, patient lives with his parents.              Patient currently is not medically stable to d/c.  Consultants:  Spoke with neurology  regarding tremors.  Procedures:  None  Antibiotics:  . Rocephin and metronidazole IV  Anti-infectives (From admission, onward)   Start     Dose/Rate Route Frequency Ordered  Stop   02/28/20 0500  vancomycin (VANCOCIN) IVPB 1000 mg/200 mL premix  Status:  Discontinued        1,000 mg 200 mL/hr over 60 Minutes Intravenous Every 12 hours 02/27/20 1551 02/27/20 1932   02/28/20 0400  vancomycin (VANCOCIN) IVPB 1000 mg/200 mL premix  Status:  Discontinued        1,000 mg 200 mL/hr over 60 Minutes Intravenous Every 12 hours 02/27/20 1532 02/27/20 1551   02/28/20 0000  ceFEPIme (MAXIPIME) 2 g in sodium chloride 0.9 % 100 mL IVPB  Status:  Discontinued        2 g 200 mL/hr over 30 Minutes Intravenous Every 8 hours 02/27/20 1517 02/27/20 1932   02/28/20 0000  metroNIDAZOLE (FLAGYL) IVPB 500 mg        500 mg 100 mL/hr over 60 Minutes Intravenous Every 8 hours 02/27/20 1843     02/27/20 2000  cefTRIAXone (ROCEPHIN) 2 g in sodium chloride 0.9 % 100 mL IVPB        2 g 200 mL/hr over 30 Minutes Intravenous Every 24 hours 02/27/20 1843     02/27/20 1530  vancomycin (VANCOREADY) IVPB 1750 mg/350 mL        1,750 mg 175 mL/hr over 120 Minutes Intravenous  Once 02/27/20 1516 02/27/20 1928   02/27/20 1515  ceFEPIme (MAXIPIME) 2 g in sodium chloride 0.9 % 100 mL IVPB        2 g 200 mL/hr over 30 Minutes Intravenous  Once 02/27/20 1509 02/27/20 1653   02/27/20 1515  metroNIDAZOLE (FLAGYL) IVPB 500 mg        500 mg 100 mL/hr over 60 Minutes Intravenous  Once 02/27/20 1509 02/27/20 1659   02/27/20 1515  vancomycin (VANCOCIN) IVPB 1000 mg/200 mL premix  Status:  Discontinued        1,000 mg 200 mL/hr over 60 Minutes Intravenous  Once 02/27/20 1509 02/27/20 1516     Subjective: Today, patient was seen and examined at bedside.  Complains of mild back pain.  Tremors have improved.  No fever, chills, nausea, vomiting, abdomen or pain or shortness of breath.   Objective: Vitals:   03/01/20 0007 03/01/20 0405  BP: 114/81 99/71  Pulse: 96   Resp: 15 17  Temp: 99 F (37.2 C)   SpO2: 100%     Intake/Output Summary (Last 24 hours) at 03/01/2020 1335 Last data filed at 03/01/2020  0500 Gross per 24 hour  Intake 3195.41 ml  Output 2400 ml  Net 795.41 ml   Filed Weights   02/28/20 0400 02/29/20 0352 03/01/20 0338  Weight: 92.2 kg 90.1 kg 91.5 kg   Body mass index is 28.94 kg/m.   Physical Exam:  GENERAL: Patient is alert awake and oriented. Not in obvious distress. HENT: No scleral pallor or icterus. Pupils equally reactive to light. Oral mucosa is moist NECK: is supple, no gross swelling noted. CHEST: Clear to auscultation. No crackles or wheezes.  Diminished breath sounds bilaterally. CVS:  S1 and S2 heard, no murmur. Regular rate and rhythm.  External urinary catheter in place. ABDOMEN: Soft, non-tender, bowel sounds are present. EXTREMITIES: No edema.  Paraplegia noted.  No tremors noted today.  Tenderness over the right lower sacral region on palpation.. CNS: Paraplegia. SKIN: warm and dry without rashes.  Data Review: I have personally reviewed the following laboratory data and studies,  CBC: Recent Labs  Lab 02/27/20 1200 02/29/20 0950 03/01/20 1057  WBC 7.9 7.6 6.5  NEUTROABS 5.7  --   --   HGB 12.0* 12.3* 12.0*  HCT 36.6* 38.0* 37.7*  MCV 82.8 83.3 85.1  PLT 443* 488* 563*   Basic Metabolic Panel: Recent Labs  Lab 02/27/20 1200 02/27/20 1744 02/27/20 1841 02/28/20 1151 02/29/20 0950 03/01/20 1057  NA 127* 129*  --  131* 133* 136  K 3.7 3.8  --  3.9 3.9 3.8  CL 94* 94*  --  96* 99 104  CO2 24 24  --  _0 GLUCOSE 100* 85  --  88 102* 116*  BUN 8 8  --  _1 CREATININE 1.11 0.96  --  0.88 1.05 0.74  CALCIUM 8.0* 7.8*  --  7.8* 8.1* 7.7*  MG  --   --  2.1  --   --  2.0  PHOS  --   --  2.9  --   --  3.1   Liver Function Tests: Recent Labs  Lab 02/27/20 1200 02/28/20 1151 03/01/20 1057  AST 113* 92* 145*  ALT 57* 52* 116*  ALKPHOS 32* 31* 36*  BILITOT 0.6 0.5 0.6  PROT 6.0* 5.8* 5.5*  ALBUMIN 3.0* 2.9* 2.7*   Recent Labs  Lab 02/27/20 1744  LIPASE 45   Recent Labs  Lab 02/27/20 1925  AMMONIA 20    Cardiac Enzymes: Recent Labs  Lab 02/29/20 1522 03/01/20 1057  CKTOTAL 2,573* 2,446*   BNP (last 3 results) No results for input(s): BNP in the last 8760 hours.  ProBNP (last 3 results) No results for input(s): PROBNP in the last 8760 hours.  CBG: No results for input(s): GLUCAP in the last 168 hours. Recent Results (from the past 240 hour(s))  Culture, blood (routine x 2)     Status: None (Preliminary result)   Collection Time: 02/27/20 11:36 AM   Specimen: BLOOD  Result Value Ref Range Status   Specimen Description BLOOD BLOOD LEFT HAND  Final   Special Requests   Final    BOTTLES DRAWN AEROBIC AND ANAEROBIC Blood Culture adequate volume   Culture   Final    NO GROWTH 2 DAYS Performed at [facility], [address] [place], [place] [address]    Report Status PENDING  Incomplete  Culture, blood (routine x 2)     Status: None (Preliminary result)   Collection Time: 02/27/20 12:29 PM   Specimen: BLOOD  Result Value Ref Range Status   Specimen Description BLOOD BLOOD RIGHT HAND  Final   Special Requests   Final    BOTTLES DRAWN AEROBIC AND ANAEROBIC Blood Culture results may not be optimal due to an excessive volume of blood received in culture bottles   Culture   Final    NO GROWTH 2 DAYS Performed at [facility], [place] [address] [place], [place] [address]    Report Status PENDING  Incomplete  SARS Coronavirus 2 by RT PCR (hospital order, performed in [facility] hospital lab) Nasopharyngeal Nasopharyngeal Swab  Status: None   Collection Time: 02/27/20 12:52 PM   Specimen: Nasopharyngeal Swab  Result Value Ref Range Status   SARS Coronavirus 2 NEGATIVE NEGATIVE Final    Comment: (NOTE) SARS-CoV-2 target nucleic acids are NOT DETECTED.  The SARS-CoV-2 RNA is generally detectable in upper and lower respiratory specimens during the acute phase of infection. The lowest concentration of SARS-CoV-2 viral copies this assay can detect is 250 copies /  mL. A negative result does not preclude SARS-CoV-2 infection and should not be used as the sole basis for treatment or other patient management decisions.  A negative result may occur with improper specimen collection / handling, submission of specimen other than nasopharyngeal swab, presence of viral mutation(s) within the areas targeted by this assay, and inadequate number of viral copies (<250 copies / mL). A negative result must be combined with clinical observations, patient history, and epidemiological information.  Fact Sheet for Patients:   StrictlyIdeas.no  Fact Sheet for Healthcare Providers: BankingDealers.co.za  This test is not yet approved or  cleared by the [place] FDA and has been authorized for detection and/or diagnosis of SARS-CoV-2 by FDA under an Emergency Use Authorization (EUA).  This EUA will remain in effect (meaning this test can be used) for the duration of the COVID-19 declaration under Section 564(b)(1) of the Act, 21 U.S.C. section 360bbb-3(b)(1), unless the authorization is terminated or revoked sooner.  Performed at [facility], [place] [address], [place], [place] [address]   Urine culture     Status: None   Collection Time: 02/27/20  1:25 PM   Specimen: Urine, Random  Result Value Ref Range Status   Specimen Description URINE, RANDOM  Final   Special Requests NONE  Final   Culture   Final    NO GROWTH Performed at [facility], [place] [address] [place], [place] [address]    Report Status 02/28/2020 FINAL  Final     Studies: MR Lumbar Spine W Wo Contrast  Result Date: 02/29/2020 CLINICAL DATA:  Rule out epidural abscess.  Paraplegia. EXAM: MRI LUMBAR SPINE WITHOUT AND WITH CONTRAST TECHNIQUE: Multiplanar and multiecho pulse sequences of the lumbar spine were obtained without and with intravenous contrast. CONTRAST:  66m GADAVIST GADOBUTROL 1 MMOL/ML IV SOLN COMPARISON:  MRI lumbar  spine 11/05/2006 FINDINGS: Segmentation:  Normal Alignment:  Normal Vertebrae:  Negative for fracture or mass.  No evidence of discitis. Conus medullaris and cauda equina: Conus extends to the L1-2 level. Conus and cauda equina appear normal. Paraspinal and other soft tissues: Prominent muscle edema is present in the posterior paraspinous muscles. The muscles show diffuse enhancement. Psoas muscle normal. No fluid collection identified. Disc levels: L1-2: Negative L2-3: Negative L3-4: Negative L4-5: Mild disc bulging and mild facet degeneration. Negative for disc protrusion or stenosis L5-S1: Mild disc degeneration with disc bulging and endplate spurring. Mild subarticular stenosis bilaterally. IMPRESSION: Extensive muscle edema and enhancement in the posterior paraspinous muscles. Findings most compatible with myositis. No abscess. No evidence of discitis or epidural abscess. Electronically Signed   By: CFranchot GalloM.D.   On: 02/29/2020 17:05     LFlora Lipps MD  [facility] 03/01/2020

## 2020-03-02 LAB — CBC
HCT: 35.6 % — ABNORMAL LOW (ref 39.0–52.0)
Hemoglobin: 11.3 g/dL — ABNORMAL LOW (ref 13.0–17.0)
MCH: 26.8 pg (ref 26.0–34.0)
MCHC: 31.7 g/dL (ref 30.0–36.0)
MCV: 84.6 fL (ref 80.0–100.0)
Platelets: 421 10*3/uL — ABNORMAL HIGH (ref 150–400)
RBC: 4.21 MIL/uL — ABNORMAL LOW (ref 4.22–5.81)
RDW: 15.9 % — ABNORMAL HIGH (ref 11.5–15.5)
WBC: 5.4 10*3/uL (ref 4.0–10.5)
nRBC: 0 % (ref 0.0–0.2)

## 2020-03-02 LAB — COMPREHENSIVE METABOLIC PANEL
ALT: 117 U/L — ABNORMAL HIGH (ref 0–44)
AST: 107 U/L — ABNORMAL HIGH (ref 15–41)
Albumin: 2.5 g/dL — ABNORMAL LOW (ref 3.5–5.0)
Alkaline Phosphatase: 36 U/L — ABNORMAL LOW (ref 38–126)
Anion gap: 10 (ref 5–15)
BUN: 5 mg/dL — ABNORMAL LOW (ref 6–20)
CO2: 24 mmol/L (ref 22–32)
Calcium: 7.9 mg/dL — ABNORMAL LOW (ref 8.9–10.3)
Chloride: 102 mmol/L (ref 98–111)
Creatinine, Ser: 0.61 mg/dL (ref 0.61–1.24)
GFR calc Af Amer: >60 mL/min (ref >60)
GFR calc non Af Amer: >60 mL/min (ref >60)
Glucose, Bld: 102 mg/dL — ABNORMAL HIGH (ref 70–99)
Potassium: 3.4 mmol/L — ABNORMAL LOW (ref 3.5–5.1)
Sodium: 136 mmol/L (ref 135–145)
Total Bilirubin: 0.3 mg/dL (ref 0.3–1.2)
Total Protein: 5.2 g/dL — ABNORMAL LOW (ref 6.5–8.1)

## 2020-03-02 LAB — CK: Total CK: 1893 U/L — ABNORMAL HIGH (ref 49–397)

## 2020-03-02 LAB — MAGNESIUM: Magnesium: 2 mg/dL (ref 1.7–2.4)

## 2020-03-02 MED ORDER — ALUM & MAG HYDROXIDE-SIMETH 200-200-20 MG/5ML PO SUSP
30.0000 mL | ORAL | Status: DC | PRN
  Administered 2020-03-02: 30 mL via ORAL
  Filled 2020-03-02: qty 30

## 2020-03-02 MED ORDER — SODIUM CHLORIDE 0.9 % IV BOLUS
1000.0000 mL | Freq: Once | INTRAVENOUS | Status: AC
  Administered 2020-03-02: 1000 mL via INTRAVENOUS

## 2020-03-02 MED ORDER — METRONIDAZOLE 500 MG PO TABS
500.0000 mg | ORAL_TABLET | Freq: Three times a day (TID) | ORAL | Status: AC
  Administered 2020-03-02 – 2020-03-05 (×8): 500 mg via ORAL
  Filled 2020-03-02 (×8): qty 1

## 2020-03-02 MED ORDER — POTASSIUM CHLORIDE CRYS ER 20 MEQ PO TBCR
40.0000 meq | EXTENDED_RELEASE_TABLET | Freq: Once | ORAL | Status: AC
  Administered 2020-03-02: 40 meq via ORAL
  Filled 2020-03-02: qty 2

## 2020-03-02 MED ORDER — DIAZEPAM 5 MG PO TABS
5.0000 mg | ORAL_TABLET | Freq: Two times a day (BID) | ORAL | Status: AC | PRN
  Administered 2020-03-02 – 2020-03-03 (×2): 5 mg via ORAL
  Filled 2020-03-02 (×2): qty 1

## 2020-03-02 MED ORDER — LABETALOL HCL 5 MG/ML IV SOLN
10.0000 mg | INTRAVENOUS | Status: DC | PRN
  Filled 2020-03-02: qty 4

## 2020-03-02 MED ORDER — LORAZEPAM 2 MG/ML IJ SOLN
1.0000 mg | Freq: Once | INTRAMUSCULAR | Status: AC
  Administered 2020-03-02: 1 mg via INTRAVENOUS
  Filled 2020-03-02: qty 1

## 2020-03-02 NOTE — Progress Notes (Signed)
   03/02/20 2219  Assess: MEWS Score  Temp (!) 100.5 F (38.1 C)  Pulse Rate (!) 157  ECG Heart Rate (!) 157  Resp 20  Assess: MEWS Score  MEWS Temp 1  MEWS Systolic 0  MEWS Pulse 3  MEWS RR 0  MEWS LOC 0  MEWS Score 4  MEWS Score Color Red  Assess: if the MEWS score is Yellow or Red  Were vital signs taken at a resting state? Yes  Focused Assessment No change from prior assessment  Early Detection of Sepsis Score *See Row Information* High  MEWS guidelines implemented *See Row Information* Yes  Treat  MEWS Interventions Administered scheduled meds/treatments;Administered prn meds/treatments;Escalated (See documentation below)  Pain Scale 0-10  Take Vital Signs  Increase Vital Sign Frequency  Red: Q 1hr X 4 then Q 4hr X 4, if remains red, continue Q 4hrs  Escalate  MEWS: Escalate Red: discuss with charge nurse/RN and provider, consider discussing with RRT  Notify: Charge Nurse/RN  Name of Charge Nurse/RN Notified Fred   Date Charge Nurse/RN Notified 03/02/20  Time Charge Nurse/RN Notified 2219  Notify: Provider  Provider Name/Title Dr Antionette Char  Date Provider Notified 03/01/20  Time Provider Notified 2041  Notification Type Page  Notification Reason Change in status  Response See new orders  Date of Provider Response 03/02/20  Time of Provider Response 2045  Notify: Rapid Response  Name of Rapid Response RN Notified n/a  Document  Patient Outcome Not stable and remains on department  Progress note created (see row info) Yes   Pt complaining of body shaking. HR 140-150's. Scheduled baclofen given.   No change with the tremors. HR 160's. MD Opyd paged. Ordered diazepam PO given.   Pt body remained shaking, MD paged. MD came to see pt.  Temp 100.5. HR remains in the 160's. Ordered IV ativan and IV fluid bolus given.  Pt body tremor subsided. Left leg shaking still present. Rechecked temp 99.8. HR 130-140's at this time. MD aware.  No complains at this time. Will continue  to monitor.

## 2020-03-02 NOTE — Progress Notes (Addendum)
PROGRESS NOTE  [name] FKC:[id number] DOB: [birth year] DOA: 02/27/2020 PCP: Jilda Panda, MD   LOS: 4 days   Brief narrative: As per HPI,  Christopher Valencia is a 44 y.o. male with medical history significant of paraplegia due to spinal cord injury, neurogenic bladder/bowel, wheelchair bound, chronic constipation presented to emergency department with shaking of body for 10 days with intermittent fever highest up to 1002.3 F..  There was some concern for pulled muscle in the back so he went to [facility] med center where he was prescribed a muscle relaxant.  He went to ED on 9/11 with fever and shaking of his body.  Work-up including UA, chest x-ray came back negative for acute findings therefore he discharged home in stable condition and was prescribed Keflex. He lives with his parents at home.he again presented to hospital this time since he was not getting better.Upon arrival to ED: Patient had fever of 100.1, tachycardic, lactic acid: 2.5, blood pressure noted to be low 80s/50s.  Normally runs on the lower side at home. CRP: 1.3, ESR: 37, COVID-19 negative, UA positive for rare bacteria.  Platelet: 443, AST: 113, ALT: 57, sodium: 127, chest x-ray negative for acute findings.  CT abdomen/pelvis and CT chest was obtained which came back positive for colitis.  Patient received IV fluid boluses, Vanco, cefepime and Flagyl in ED.   Patient was then admitted to hospital for further evaluation and treatment.  Assessment/Plan:  Principal Problem:   Colitis Active Problems:   Neurogenic bowel   Neurogenic bladder   Paraplegia following spinal cord injury ([place])   Hyponatremia   Elevated liver enzymes  Mild Colitis: Currently on Rocephin and Flagyl. Continue for now.  Mild rhabdomyolysis.  CK level is elevated at 2500>1863.  Renal function is stable so far.  Patient is bedbound and has myositis on the posterior paraspinals muscles on the MRI scan.  Likely rhabdomyolysis contributing to elevated  LFT.  We will continue with IV fluids and monitor urine output.  Shaking of body/tremors: Common in patients with paraplegia and could be exacerbated by infection.  Improved with baclofen. Had spoken with neurology about it.  Patient did have back pain and MRI was performed since he had fever but only myositis was identified. No evidence of abscess.  Mild hypokalemia.  Will replenish.  Fever, chills with elevated lactate.  Negative prolactin but elevated lactate.  Received IV fluids.  No other source of infection so far except for colitis and mild myositis.  Mild elevated ESR.  T-max of 98.9 at this time.  Urinalysis was negative.  CT chest was negative for acute findings.  CT abdomen/pelvis showed colitis.  Blood cultures negative so far.  Hepatitis panel negative.  HIV negative.    Elevated liver enzymes: AST: 113, ALT: 57 on presentation.  INR within normal limits.  Hepatitis panel was negative.  Possibly from rhabdomyolysis.   Hyponatremia: Sodium of 127 on presentation.  Has improved to 136 today.  History of paraplegia following spinal cord injury: Neurogenic bladder/bowel: Unchanged.  DVT prophylaxis: enoxaparin (LOVENOX) injection 40 mg Start: 02/27/20 1845 SCDs Start: 02/27/20 1842   Code Status: Full code  Family Communication: I tried to call the patient's mother Ms Gwinda Passe on the phone at [phone] but was unable to reach her.  Status is: Inpatient  Remains inpatient appropriate because:IV treatments appropriate due to intensity of illness or inability to take PO and Inpatient level of care appropriate due to severity of illness, lactic acidosis, fever, elevated  LFT.   Dispo: The patient is from: Home              Anticipated d/c is to: Home              Anticipated d/c date is: likely in am, patient lives with parents.              Patient currently is not medically stable to d/c.  Consultants:  Spoke with neurology    Procedures:  None  Antibiotics:  . Rocephin and metronidazole IV 9/21> .  Subjective: Today, patient was seen and examined at bedside.  Patient feels much better today no tremors in his legs.  Was able to move his body.  Denies any nausea vomiting abdominal pain.   Objective: Vitals:   03/02/20 0000 03/02/20 0332  BP: (!) 127/93 (!) 116/99  Pulse: 69 67  Resp: 20 11  Temp: (!) 97.3 F (36.3 C) 98.4 F (36.9 C)  SpO2: 100% 100%    Intake/Output Summary (Last 24 hours) at 03/02/2020 0740 Last data filed at 03/02/2020 0600 Gross per 24 hour  Intake 420 ml  Output 3200 ml  Net -2780 ml   Filed Weights   02/29/20 0352 03/01/20 0338 03/02/20 0332  Weight: 90.1 kg 91.5 kg 92.5 kg   Body mass index is 29.26 kg/m.   Physical Exam:  General:  Average built, not in obvious distress, alert awake oriented HENT:   No scleral pallor or icterus noted. Oral mucosa is moist.  Chest:  Clear breath sounds.  Diminished breath sounds bilaterally. No crackles or wheezes.  CVS: S1 &S2 heard. No murmur.  Regular rate and rhythm. Abdomen: Soft, nontender, nondistended.  Bowel sounds are heard.  External urinary catheter in place.  Tenderness noted over the lumbosacral spine. Extremities: No cyanosis, clubbing or edema.  Paraplegia noted. Psych: Alert, awake and oriented, normal mood CNS:  No cranial nerve deficits.  Paraplegia noted. Skin: Warm and dry.  No rashes noted.  Data Review: I have personally reviewed the following laboratory data and studies,  CBC: Recent Labs  Lab 02/27/20 1200 02/29/20 0950 03/01/20 1057 03/02/20 0347  WBC 7.9 7.6 6.5 5.4  NEUTROABS 5.7  --   --   --   HGB 12.0* 12.3* 12.0* 11.3*  HCT 36.6* 38.0* 37.7* 35.6*  MCV 82.8 83.3 85.1 84.6  PLT 443* 488* 417* 482*   Basic Metabolic Panel: Recent Labs  Lab 02/27/20 1744 02/27/20 1841 02/28/20 1151 02/29/20 0950 03/01/20 1057 03/02/20 0347  NA 129*  --  131* 133* 136 136  K 3.8  --  3.9 3.9 3.8  3.4*  CL 94*  --  96* 99 104 102  CO2 24  --  _0 GLUCOSE 85  --  88 102* 116* 102*  BUN 8  --  _1 5*  CREATININE 0.96  --  0.88 1.05 0.74 0.61  CALCIUM 7.8*  --  7.8* 8.1* 7.7* 7.9*  MG  --  2.1  --   --  2.0 2.0  PHOS  --  2.9  --   --  3.1  --    Liver Function Tests: Recent Labs  Lab 02/27/20 1200 02/28/20 1151 03/01/20 1057 03/02/20 0347  AST 113* 92* 145* 107*  ALT 57* 52* 116* 117*  ALKPHOS 32* 31* 36* 36*  BILITOT 0.6 0.5 0.6 0.3  PROT 6.0* 5.8* 5.5* 5.2*  ALBUMIN 3.0* 2.9* 2.7* 2.5*   Recent Labs  Lab  02/27/20 1744  LIPASE 45   Recent Labs  Lab 02/27/20 1925  AMMONIA 20   Cardiac Enzymes: Recent Labs  Lab 02/29/20 1522 03/01/20 1057 03/02/20 0347  CKTOTAL 2,573* 2,446* 1,893*   BNP (last 3 results) No results for input(s): BNP in the last 8760 hours.  ProBNP (last 3 results) No results for input(s): PROBNP in the last 8760 hours.  CBG: No results for input(s): GLUCAP in the last 168 hours. Recent Results (from the past 240 hour(s))  Culture, blood (routine x 2)     Status: None (Preliminary result)   Collection Time: 02/27/20 11:36 AM   Specimen: BLOOD  Result Value Ref Range Status   Specimen Description BLOOD BLOOD LEFT HAND  Final   Special Requests   Final    BOTTLES DRAWN AEROBIC AND ANAEROBIC Blood Culture adequate volume   Culture   Final    NO GROWTH 2 DAYS Performed at [facility], [address] [place], [place] [address]    Report Status PENDING  Incomplete  Culture, blood (routine x 2)     Status: None (Preliminary result)   Collection Time: 02/27/20 12:29 PM   Specimen: BLOOD  Result Value Ref Range Status   Specimen Description BLOOD BLOOD RIGHT HAND  Final   Special Requests   Final    BOTTLES DRAWN AEROBIC AND ANAEROBIC Blood Culture results may not be optimal due to an excessive volume of blood received in culture bottles   Culture   Final    NO GROWTH 2 DAYS Performed at [facility],  [place] [address], [place], [place] [address]    Report Status PENDING  Incomplete  SARS Coronavirus 2 by RT PCR (hospital order, performed in [facility] hospital lab) Nasopharyngeal Nasopharyngeal Swab     Status: None   Collection Time: 02/27/20 12:52 PM   Specimen: Nasopharyngeal Swab  Result Value Ref Range Status   SARS Coronavirus 2 NEGATIVE NEGATIVE Final    Comment: (NOTE) SARS-CoV-2 target nucleic acids are NOT DETECTED.  The SARS-CoV-2 RNA is generally detectable in upper and lower respiratory specimens during the acute phase of infection. The lowest concentration of SARS-CoV-2 viral copies this assay can detect is 250 copies / mL. A negative result does not preclude SARS-CoV-2 infection and should not be used as the sole basis for treatment or other patient management decisions.  A negative result may occur with improper specimen collection / handling, submission of specimen other than nasopharyngeal swab, presence of viral mutation(s) within the areas targeted by this assay, and inadequate number of viral copies (<250 copies / mL). A negative result must be combined with clinical observations, patient history, and epidemiological information.  Fact Sheet for Patients:   StrictlyIdeas.no  Fact Sheet for Healthcare Providers: BankingDealers.co.za  This test is not yet approved or  cleared by the [place] FDA and has been authorized for detection and/or diagnosis of SARS-CoV-2 by FDA under an Emergency Use Authorization (EUA).  This EUA will remain in effect (meaning this test can be used) for the duration of the COVID-19 declaration under Section 564(b)(1) of the Act, 21 U.S.C. section 360bbb-3(b)(1), unless the authorization is terminated or revoked sooner.  Performed at [facility], [place] [address], [place], [place] [address]   Urine culture     Status: None   Collection Time: 02/27/20  1:25 PM   Specimen: Urine,  Random  Result Value Ref Range Status   Specimen Description URINE, RANDOM  Final   Special Requests NONE  Final   Culture   Final    NO GROWTH Performed at [facility], [place] [address] [place], [place] [address]    Report Status 02/28/2020 FINAL  Final     Studies: MR Lumbar Spine W Wo Contrast  Result Date: 02/29/2020 CLINICAL DATA:  Rule out epidural abscess.  Paraplegia. EXAM: MRI LUMBAR SPINE WITHOUT AND WITH CONTRAST TECHNIQUE: Multiplanar and multiecho pulse sequences of the lumbar spine were obtained without and with intravenous contrast. CONTRAST:  57m GADAVIST GADOBUTROL 1 MMOL/ML IV SOLN COMPARISON:  MRI lumbar spine 11/05/2006 FINDINGS: Segmentation:  Normal Alignment:  Normal Vertebrae:  Negative for fracture or mass.  No evidence of discitis. Conus medullaris and cauda equina: Conus extends to the L1-2 level. Conus and cauda equina appear normal. Paraspinal and other soft tissues: Prominent muscle edema is present in the posterior paraspinous muscles. The muscles show diffuse enhancement. Psoas muscle normal. No fluid collection identified. Disc levels: L1-2: Negative L2-3: Negative L3-4: Negative L4-5: Mild disc bulging and mild facet degeneration. Negative for disc protrusion or stenosis L5-S1: Mild disc degeneration with disc bulging and endplate spurring. Mild subarticular stenosis bilaterally. IMPRESSION: Extensive muscle edema and enhancement in the posterior paraspinous muscles. Findings most compatible with myositis. No abscess. No evidence of discitis or epidural abscess. Electronically Signed   By: CFranchot GalloM.D.   On: 02/29/2020 17:05     LFlora Lipps MD  [facility] 03/02/2020

## 2020-03-03 ENCOUNTER — Inpatient Hospital Stay (HOSPITAL_COMMUNITY): Payer: Medicare Other

## 2020-03-03 DIAGNOSIS — R609 Edema, unspecified: Secondary | ICD-10-CM

## 2020-03-03 DIAGNOSIS — R509 Fever, unspecified: Secondary | ICD-10-CM

## 2020-03-03 LAB — CULTURE, BLOOD (ROUTINE X 2)
Culture: NO GROWTH
Culture: NO GROWTH
Special Requests: ADEQUATE

## 2020-03-03 LAB — BASIC METABOLIC PANEL
Anion gap: 8 (ref 5–15)
BUN: 5 mg/dL — ABNORMAL LOW (ref 6–20)
CO2: 24 mmol/L (ref 22–32)
Calcium: 8.3 mg/dL — ABNORMAL LOW (ref 8.9–10.3)
Chloride: 102 mmol/L (ref 98–111)
Creatinine, Ser: 0.82 mg/dL (ref 0.61–1.24)
GFR calc Af Amer: >60 mL/min (ref >60)
GFR calc non Af Amer: >60 mL/min (ref >60)
Glucose, Bld: 85 mg/dL (ref 70–99)
Potassium: 4.1 mmol/L (ref 3.5–5.1)
Sodium: 134 mmol/L — ABNORMAL LOW (ref 135–145)

## 2020-03-03 LAB — LACTIC ACID, PLASMA: Lactic Acid, Venous: 2.1 mmol/L (ref 0.5–1.9)

## 2020-03-03 MED ORDER — HYDRALAZINE HCL 20 MG/ML IJ SOLN
10.0000 mg | Freq: Four times a day (QID) | INTRAMUSCULAR | Status: DC | PRN
  Administered 2020-03-03: 10 mg via INTRAVENOUS
  Filled 2020-03-03: qty 1

## 2020-03-03 MED ORDER — LEVOFLOXACIN 750 MG PO TABS
750.0000 mg | ORAL_TABLET | ORAL | Status: AC
  Administered 2020-03-03 – 2020-03-05 (×2): 750 mg via ORAL
  Filled 2020-03-03 (×2): qty 1

## 2020-03-03 NOTE — Consult Note (Signed)
[facility]                                                                                        Patient Identification: Patient Name: Christopher Valencia MRN: [id number] Admit Date: 02/27/2020 11:17 AM Today's Date: 03/03/2020 Reason for consult:   Principal Problem:   Colitis Active Problems:   Neurogenic bowel   Neurogenic bladder   Paraplegia following spinal cord injury (HCC)   Hyponatremia   Elevated liver enzymes   Antibiotics:  Metronidazole Day 6                    Ceftriaxone day 6   Assessment ? Colitis Patient denies having diarrhea/loose stool prior to admit. CT abd/pelvis showed  Findings s/o mild thickeneing of mid to distal sigmoid colon concerning for mild colitis. Patient does not have diarrhea currently. He says he has broken out in his face  after he started getting the IV abx. Today is Day 6 of ceftriaxone and metronidazole  Myositis  This was seen in the MRI Lumbar spine done on 9/23 which could be related to muscular strain. It did not show any abscess, discitis or epidural abscess. Would have a low threshold to scan this region again if develops severe back pain/neurological deficitis/sepsis.   Fevers, Unclear cause Patient has had low grade fevers on admit (100.8) which resolved and had one episode low grade fever yesterday (100.5). No leukocytosis. procalcitonin is 0.25. UA negative, urine and blood cx NG. No findings in CT chest abdomen pelvis except colitis. No signs of phlebitis. No central lines.    Recommendations  Would change ceftriaxone to levofloxacin (? Allergy) and continue Metronidazole for colitis to complete a course of 7 -10 days total.  Follow fever curve  Evaluate for non infectious causes of fever like Duplex of LE, etc  Dr Drue SecondSnider will see tomorrow  Rest of the management as per the primary team. Pease call with pertinent questions or  concerns.  Odette FractionSabina Adonai Helzer, MD Infectious Diseases  [facility]   To contact the attending provider between 8A-5P or the covering provider during after hours 5P-8A, please log into the web site [email or web address] and access using universal [place] password for that web site. If you do not have the password, please call the hospital operator. __________________________________________________________________________________________________________ HPI and Hospital Course: Christopher Valencia a 44 y.o.malewith a PMH of significant ofparaplegia due to spinal cord injury, neurogenic bladder/bowel, wheelchair bound, chronic constipation presented to emergency department on 9/21 with shaking of body for 10 days associated with intermittent fevers. He was seen in the ED on 9/10 for muscle spasms. Patient says he attempted to open a refrigerator with his right arm and felt as if he pulled a muscle on the right side of his back. He was discharged from the ED with a course of flexeril. Symptoms did not improve and he has continued to have tremors.  He has also developed a fever as high as 102. Infectious work up have been so far negative. COVID-19 negative, UA positive for rare bacteria. Chest x-ray negative for acute findings.  CT abdomen/pelvis and CT chest positive for colitis and is on IV abx for the same.   ROS: General- Denies loss of appetite and loss of weight HEENT - denies headache, blurry vision, neck pain Chest - denies any chest pain, SOB or cough CVS- Denies any dizziness, palpitations Abdomen- denies any nausea, vomiting, abdominal pain and diarrhea Neuro - Denies any weakness, numbness  Psych - denies any changes in mood irritability or depressive symptoms GU- INCONTINENT  Skin - ALLERGIC RASHES IN FACIAL REGION  MSK - denies any joint pain/swelling or ROM   Past Medical History:  Diagnosis Date   Paraplegia following spinal cord injury (HCC)    Spinal  cord injury, C5-C7 (HCC)      Scheduled Meds:  baclofen  10 mg Oral TID   bisacodyl  10 mg Rectal Daily   enoxaparin (LOVENOX) injection  40 mg Subcutaneous Q24H   metroNIDAZOLE  500 mg Oral Q8H   Continuous Infusions:  sodium chloride Stopped (02/29/20 0944)   cefTRIAXone (ROCEPHIN)  IV 2 g (03/02/20 2037)   PRN Meds:.sodium chloride, acetaminophen **OR** acetaminophen, alum & mag hydroxide-simeth, diazepam, labetalol, lip balm, ondansetron **OR** ondansetron (ZOFRAN) IV, oxyCODONE  No Known Allergies  Social History   Socioeconomic History   Marital status: Single    Spouse name: Not on file   Number of children: Not on file   Years of education: Not on file   Highest education level: Not on file  Occupational History   Not on file  Tobacco Use   Smoking status: Never Smoker   Smokeless tobacco: Never Used  Substance and Sexual Activity   Alcohol use: No   Drug use: No   Sexual activity: Not Currently  Other Topics Concern   Not on file  Social History Narrative   Not on file   Social Determinants of Health   Financial Resource Strain:    Difficulty of Paying Living Expenses: Not on file  Food Insecurity:    Worried About Running Out of Food in the Last Year: Not on file   Ran Out of Food in the Last Year: Not on file  Transportation Needs:    Lack of Transportation (Medical): Not on file   Lack of Transportation (Non-Medical): Not on file  Physical Activity:    Days of Exercise per Week: Not on file   Minutes of Exercise per Session: Not on file  Stress:    Feeling of Stress : Not on file  Social Connections:    Frequency of Communication with Friends and Family: Not on file   Frequency of Social Gatherings with Friends and Family: Not on file   Attends Religious Services: Not on file   Active Member of Clubs or Organizations: Not on file   Attends Banker Meetings: Not on file   Marital Status: Not on file   Intimate Partner Violence:    Fear of Current or Ex-Partner: Not on file   Emotionally Abused: Not on file   Physically Abused: Not on file   Sexually Abused: Not on file    Vitals BP (!) 129/99 (BP Location: Right Arm)    Pulse (!) 106    Temp 98.3 F (36.8 C) (Oral)    Resp 16    Ht 5\' 10"  (1.778 m)    Wt 96 kg    SpO2 100%    BMI 30.37 kg/m   Examination  Gen: Alert and oriented x 3, not in acute distress HEENT: [place]/AT, PERLA,  no scleral icterus, no pale conjunctivae, hearing normal, mucosa moist, DENTAL CARIES + Neck: Supple, no lymphadenopathy Cardio: RRR, +S1 and S2, no murmur  Resp: CTAB; no wheezes, rhonchi, or rales GI: Soft, nontender, nondistended, bowel sounds + Musc: No joint swelling or tenderness Extremities: No cyanosis, clubbing, or edema; palpable PT and DP pulses Skin: DESQUAMATION OF SKIN THE FACIAL REGION  Neuro: PARAPLEGIC Psych: Calm, cooperative   Microbiology Results for orders placed or performed during the hospital encounter of 02/27/20  Culture, blood (routine x 2)     Status: None   Collection Time: 02/27/20 11:36 AM   Specimen: BLOOD  Result Value Ref Range Status   Specimen Description BLOOD BLOOD LEFT HAND  Final   Special Requests   Final    BOTTLES DRAWN AEROBIC AND ANAEROBIC Blood Culture adequate volume   Culture   Final    NO GROWTH 5 DAYS Performed at [facility], [address] [place], [place] [address]    Report Status 03/03/2020 FINAL  Final  Culture, blood (routine x 2)     Status: None   Collection Time: 02/27/20 12:29 PM   Specimen: BLOOD  Result Value Ref Range Status   Specimen Description BLOOD BLOOD RIGHT HAND  Final   Special Requests   Final    BOTTLES DRAWN AEROBIC AND ANAEROBIC Blood Culture results may not be optimal due to an excessive volume of blood received in culture bottles   Culture   Final    NO GROWTH 5 DAYS Performed at [facility], [address], [place], [place] [address]     Report Status 03/03/2020 FINAL  Final  SARS Coronavirus 2 by RT PCR (hospital order, performed in [facility] hospital lab) Nasopharyngeal Nasopharyngeal Swab     Status: None   Collection Time: 02/27/20 12:52 PM   Specimen: Nasopharyngeal Swab  Result Value Ref Range Status   SARS Coronavirus 2 NEGATIVE NEGATIVE Final    Comment: (NOTE) SARS-CoV-2 target nucleic acids are NOT DETECTED.  The SARS-CoV-2 RNA is generally detectable in upper and lower respiratory specimens during the acute phase of infection. The lowest concentration of SARS-CoV-2 viral copies this assay can detect is 250 copies / mL. A negative result does not preclude SARS-CoV-2 infection and should not be used as the sole basis for treatment or other patient management decisions.  A negative result may occur with improper specimen collection / handling, submission of specimen other than nasopharyngeal swab, presence of viral mutation(s) within the areas targeted by this assay, and inadequate number of viral copies (<250 copies / mL). A negative result must be combined with clinical observations, patient history, and epidemiological information.  Fact Sheet for Patients:   BoilerBrush.com.cy  Fact Sheet for Healthcare Providers: https://pope.com/  This test is not yet approved or  cleared by the [place] FDA and has been authorized for detection and/or diagnosis of SARS-CoV-2 by FDA under an Emergency Use Authorization (EUA).  This EUA will remain in effect (meaning this test can be used) for the duration of the COVID-19 declaration under Section 564(b)(1) of the Act, 21 U.S.C. section 360bbb-3(b)(1), unless the authorization is terminated or revoked sooner.  Performed at [facility], [address], [place], [place] [address]   Urine culture     Status: None   Collection Time: 02/27/20  1:25 PM   Specimen: Urine, Random  Result Value Ref Range Status    Specimen Description URINE, RANDOM  Final  Special Requests NONE  Final   Culture   Final    NO GROWTH Performed at [facility], [address] [place], [place] [address]    Report Status 02/28/2020 FINAL  Final     Pertinent Lab seen by me: CBC Latest Ref Rng & Units 03/02/2020 03/01/2020 02/29/2020  WBC 4.0 - 10.5 K/uL 5.4 6.5 7.6  Hemoglobin 13.0 - 17.0 g/dL 11.3(L) 12.0(L) 12.3(L)  Hematocrit 39 - 52 % 35.6(L) 37.7(L) 38.0(L)  Platelets 150 - 400 K/uL 421(H) 417(H) 488(H)   CMP Latest Ref Rng & Units 03/03/2020 03/02/2020 03/01/2020  Glucose 70 - 99 mg/dL 85 604(V) 409(W)  BUN 6 - 20 mg/dL 5(L) 5(L) 6  Creatinine 0.61 - 1.24 mg/dL 1.19 1.47 8.29  Sodium 135 - 145 mmol/L 134(L) 136 136  Potassium 3.5 - 5.1 mmol/L 4.1 3.4(L) 3.8  Chloride 98 - 111 mmol/L 102 102 104  CO2 22 - 32 mmol/L Calcium 8.9 - 10.3 mg/dL 8.3(L) 7.9(L) 7.7(L)  Total Protein 6.5 - 8.1 g/dL - 5.2(L) 5.5(L)  Total Bilirubin 0.3 - 1.2 mg/dL - 0.3 0.6  Alkaline Phos 38 - 126 U/L - 36(L) 36(L)  AST 15 - 41 U/L - 107(H) 145(H)  ALT 0 - 44 U/L - 117(H) 116(H)     Pertinent Imagings/Other Imagings Plain films and CT images have been personally visualized and interpreted; radiology reports have been reviewed. Decision making incorporated into the Impression / Recommendations.  CT Chest/abdomen/pelvis WO contrast  FINDINGS: CT CHEST FINDINGS  Cardiovascular: No significant vascular findings. Normal heart size. No pericardial effusion.  Mediastinum/Nodes: No enlarged mediastinal, hilar, or axillary lymph nodes. Thyroid gland, trachea, and esophagus demonstrate no significant findings.  Lungs/Pleura: Very mild scarring and/or atelectasis is seen within the posteromedial aspect of the left lung base. There is no evidence of a pleural effusion or pneumothorax.  Musculoskeletal: No chest wall mass or suspicious bone lesions identified.  CT ABDOMEN PELVIS FINDINGS  Hepatobiliary:  No focal liver abnormality is seen. No gallstones, gallbladder wall thickening, or biliary dilatation.  Pancreas: Unremarkable. No pancreatic ductal dilatation or surrounding inflammatory changes.  Spleen: Normal in size without focal abnormality.  Adrenals/Urinary Tract: Adrenal glands are unremarkable. Kidneys are normal, without renal calculi, focal lesion, or hydronephrosis. Bladder is unremarkable.  Stomach/Bowel: Stomach is within normal limits. Appendix appears normal. No evidence of bowel dilatation. Mild thickening of the mid to distal sigmoid colon is seen.  Vascular/Lymphatic: No significant vascular findings are present. No enlarged abdominal or pelvic lymph nodes.  Reproductive: The prostate gland is mildly enlarged.  Other: No abdominal wall hernia or abnormality. No abdominopelvic ascites.  Musculoskeletal: No acute or significant osseous findings.  IMPRESSION:  1. No evidence of acute or active cardiopulmonary disease.  2. Mild thickening of the mid to distal sigmoid colon which may represent sequelae associated with mild colitis.   MRI lumbar spine WO contrast 02/29/20 FINDINGS: Segmentation:  Normal  Alignment:  Normal  Vertebrae:  Negative for fracture or mass.  No evidence of discitis.  Conus medullaris and cauda equina: Conus extends to the L1-2 level. Conus and cauda equina appear normal.  Paraspinal and other soft tissues: Prominent muscle edema is present in the posterior paraspinous muscles. The muscles show diffuse enhancement. Psoas muscle normal. No fluid collection identified.  Disc levels:  L1-2: Negative  L2-3: Negative  L3-4: Negative  L4-5: Mild disc bulging and mild facet degeneration. Negative for disc protrusion or stenosis  L5-S1: Mild disc  degeneration with disc bulging and endplate spurring. Mild subarticular stenosis bilaterally.  IMPRESSION: Extensive muscle edema and enhancement in the  posterior paraspinous muscles. Findings most compatible with myositis. No abscess.  No evidence of discitis or epidural abscess.

## 2020-03-03 NOTE — Progress Notes (Signed)
VASCULAR LAB    Bilateral lower extremity venous duplex has been performed.  See CV proc for preliminary results.   Jericca Russett, RVT 03/03/2020, 6:24 PM

## 2020-03-03 NOTE — Progress Notes (Signed)
Per dr Tyson Babinski, ok for patient to have gatarade to drink, mom to bring from home

## 2020-03-03 NOTE — Progress Notes (Deleted)
VASCULAR LAB    Bilateral lower extremity venous duplex has been performed.  See CV proc for preliminary results.   Osmin Welz, RVT 03/03/2020, 6:25 PM

## 2020-03-03 NOTE — Progress Notes (Addendum)
PROGRESS NOTE  MOUSTAPHA TOOKER UXL:[id number] DOB: 02-19-1976 DOA: 02/27/2020 PCP: Jilda Panda, MD   LOS: 5 days   Brief narrative: As per HPI,  Christopher Valencia is a 44 y.o. male with medical history significant of paraplegia due to spinal cord injury, neurogenic bladder/bowel, wheelchair bound, chronic constipation presented to emergency department with shaking of body for 10 days with intermittent fever highest up to 1002.3 F..  There was some concern for pulled muscle in the back so he went to [facility] med center where he was prescribed a muscle relaxant.  He went to ED on 9/11 with fever and shaking of his body.  Work-up including UA, chest x-ray came back negative for acute findings therefore he discharged home in stable condition and was prescribed Keflex. He lives with his parents at home.he again presented to hospital this time since he was not getting better.Upon arrival to ED: Patient had fever of 100.1, tachycardic, lactic acid: 2.5, blood pressure noted to be low 80s/50s.  Normally runs on the lower side at home. CRP: 1.3, ESR: 37, COVID-19 negative, UA positive for rare bacteria.  Platelet: 443, AST: 113, ALT: 57, sodium: 127, chest x-ray negative for acute findings.  CT abdomen/pelvis and CT chest was obtained which came back positive for colitis.  Patient received IV fluid boluses, Vanco, cefepime and Flagyl in ED.   Patient was then admitted to hospital for further evaluation and treatment.  Assessment/Plan:  Principal Problem:   Colitis Active Problems:   Neurogenic bowel   Neurogenic bladder   Paraplegia following spinal cord injury ([place])   Hyponatremia   Elevated liver enzymes  Fever, chills with elevated lactate.  Negative prolactin but elevated lactate.  Received IV fluids.  No other source of infection so far except for colitis and mild myositis.  Mild elevated ESR.  T-max of 100.5 F again.  Urinalysis was negative.  CT chest was negative for acute findings.  CT  abdomen/pelvis showed colitis.   Hepatitis panel negative.  HIV negative.   MRI of the back showed myositis.  Clear etiology of fever at this time.  Spoke with the infectious disease for consultation.  Blood cultures negative so far.  Will do ultrasound of the lower extremities to rule out DVT to rule out any noninfective cause of fever  Mild Colitis:  Received Rocephin and Flagyl.  Now on Levaquin and metronidazole as per ID.  Mild rhabdomyolysis.  CK level is elevated at 2500>1863.  Renal function is stable so far.  Patient is bedbound and has myositis on the posterior paraspinal muscles on the MRI scan.  Likely rhabdomyolysis contributing to elevated LFT.  We will continue with IV fluids and monitor urine output.  Shaking of body/tremors: Common in patients with paraplegia and could be exacerbated by infection.  Improved with baclofen.  Patient did have tremors again yesterday with a spike of fever.  Had spoken with neurology about it.  Patient did have back pain and MRI was performed since he had fever but only myositis was identified. No evidence of abscess.  Mild hypokalemia.  Replenished and improved..  Elevated liver enzymes: AST: 113, ALT: 57 on presentation.  INR within normal limits.  Hepatitis panel was negative.  Possibly from rhabdomyolysis.   Hyponatremia: Sodium of 127 on presentation.  Has improved to 134 today.  History of paraplegia following spinal cord injury: Neurogenic bladder/bowel: Unchanged.  DVT prophylaxis: enoxaparin (LOVENOX) injection 40 mg Start: 02/27/20 1845 SCDs Start: 02/27/20 1842   Code  Status: Full code  Family Communication:  I spoke with the patient's mother on the phone and updated her about the clinical condition of the patient.   Status is: Inpatient  Remains inpatient appropriate because:IV treatments appropriate due to intensity of illness or inability to take PO and Inpatient level of care appropriate due to severity of illness, lactic  acidosis, fever, elevated LFT, persistent fever.   Dispo: The patient is from: Home              Anticipated d/c is to: Home              Anticipated d/c date is: 1 to 2 days              Patient currently is not medically stable to d/c.  Consultants:  Spoke with neurology   Infectious disease  Procedures:  None  Antibiotics:  . Rocephin and metronidazole IV 9/21>9/25 . Flagyl and levaquin 9/26>  Subjective: Today, patient feels okay this morning but had tremors shaking and fever yesterday.  Denies any nausea, vomiting, abdominal pain.  Denies any shortness of breath, cough.  Objective: Vitals:   03/03/20 0500 03/03/20 0700  BP: 115/86 (!) 129/99  Pulse: (!) 115 (!) 106  Resp: 17 16  Temp:  98.3 F (36.8 C)  SpO2: 97% 100%    Intake/Output Summary (Last 24 hours) at 03/03/2020 1251 Last data filed at 03/03/2020 0700 Gross per 24 hour  Intake 1440 ml  Output 2400 ml  Net -960 ml   Filed Weights   03/01/20 0338 03/02/20 0332 03/03/20 0400  Weight: 91.5 kg 92.5 kg 96 kg   Body mass index is 30.37 kg/m.   Physical Exam: General:  Average built, not in obvious distress, alert awake oriented HENT:   No scleral pallor or icterus noted. Oral mucosa is moist.  Chest:  Clear breath sounds.  Diminished breath sounds bilaterally. No crackles or wheezes.  CVS: S1 &S2 heard. No murmur.  Regular rate and rhythm. Abdomen: Soft, nontender, nondistended.  Bowel sounds are heard.  External urinary catheter in place.  Tenderness noted over the lumbosacral spine. Extremities: No cyanosis, clubbing or edema.  Paraplegia noted. Psych: Alert, awake and oriented, normal mood   CNS:  No cranial nerve deficits.  Paraplegia noted. Skin: Warm and dry.  No rashes noted.  Data Review: I have personally reviewed the following laboratory data and studies,  CBC: Recent Labs  Lab 02/27/20 1200 02/29/20 0950 03/01/20 1057 03/02/20 0347  WBC 7.9 7.6 6.5 5.4  NEUTROABS 5.7  --   --    --   HGB 12.0* 12.3* 12.0* 11.3*  HCT 36.6* 38.0* 37.7* 35.6*  MCV 82.8 83.3 85.1 84.6  PLT 443* 488* 417* 449*   Basic Metabolic Panel: Recent Labs  Lab 02/27/20 1744 02/27/20 1841 02/28/20 1151 02/29/20 0950 03/01/20 1057 03/02/20 0347 03/03/20 0654  NA   < >  --  131* 133* 136 136 134*  K   < >  --  3.9 3.9 3.8 3.4* 4.1  CL   < >  --  96* 99 104 102 102  CO2   < >  --  23 23 22 24 24   GLUCOSE   < >  --  88 102* 116* 102* 85  BUN   < >  --  8 7 6  5* 5*  CREATININE   < >  --  0.88 1.05 0.74 0.61 0.82  CALCIUM   < >  --  7.8* 8.1* 7.7* 7.9* 8.3*  MG  --  2.1  --   --  2.0 2.0  --   PHOS  --  2.9  --   --  3.1  --   --    < > = values in this interval not displayed.   Liver Function Tests: Recent Labs  Lab 02/27/20 1200 02/28/20 1151 03/01/20 1057 03/02/20 0347  AST 113* 92* 145* 107*  ALT 57* 52* 116* 117*  ALKPHOS 32* 31* 36* 36*  BILITOT 0.6 0.5 0.6 0.3  PROT 6.0* 5.8* 5.5* 5.2*  ALBUMIN 3.0* 2.9* 2.7* 2.5*   Recent Labs  Lab 02/27/20 1744  LIPASE 45   Recent Labs  Lab 02/27/20 1925  AMMONIA 20   Cardiac Enzymes: Recent Labs  Lab 02/29/20 1522 03/01/20 1057 03/02/20 0347  CKTOTAL 2,573* 2,446* 1,893*   BNP (last 3 results) No results for input(s): BNP in the last 8760 hours.  ProBNP (last 3 results) No results for input(s): PROBNP in the last 8760 hours.  CBG: No results for input(s): GLUCAP in the last 168 hours. Recent Results (from the past 240 hour(s))  Culture, blood (routine x 2)     Status: None   Collection Time: 02/27/20 11:36 AM   Specimen: BLOOD  Result Value Ref Range Status   Specimen Description BLOOD BLOOD LEFT HAND  Final   Special Requests   Final    BOTTLES DRAWN AEROBIC AND ANAEROBIC Blood Culture adequate volume   Culture   Final    NO GROWTH 5 DAYS Performed at [facility], [address] [place], [place] [address]    Report Status 03/03/2020 FINAL  Final  Culture, blood (routine x 2)     Status: None    Collection Time: 02/27/20 12:29 PM   Specimen: BLOOD  Result Value Ref Range Status   Specimen Description BLOOD BLOOD RIGHT HAND  Final   Special Requests   Final    BOTTLES DRAWN AEROBIC AND ANAEROBIC Blood Culture results may not be optimal due to an excessive volume of blood received in culture bottles   Culture   Final    NO GROWTH 5 DAYS Performed at [facility], [place] [address] [place], [place] [address]    Report Status 03/03/2020 FINAL  Final  SARS Coronavirus 2 by RT PCR (hospital order, performed in [facility] hospital lab) Nasopharyngeal Nasopharyngeal Swab     Status: None   Collection Time: 02/27/20 12:52 PM   Specimen: Nasopharyngeal Swab  Result Value Ref Range Status   SARS Coronavirus 2 NEGATIVE NEGATIVE Final    Comment: (NOTE) SARS-CoV-2 target nucleic acids are NOT DETECTED.  The SARS-CoV-2 RNA is generally detectable in upper and lower respiratory specimens during the acute phase of infection. The lowest concentration of SARS-CoV-2 viral copies this assay can detect is 250 copies / mL. A negative result does not preclude SARS-CoV-2 infection and should not be used as the sole basis for treatment or other patient management decisions.  A negative result may occur with improper specimen collection / handling, submission of specimen other than nasopharyngeal swab, presence of viral mutation(s) within the areas targeted by this assay, and inadequate number of viral copies (<250 copies / mL). A negative result must be combined with clinical observations, patient history, and epidemiological information.  Fact Sheet for Patients:   StrictlyIdeas.no  Fact Sheet for Healthcare Providers: BankingDealers.co.za  This test is not yet approved or  cleared by the [place] FDA and has been authorized for detection and/or diagnosis of SARS-CoV-2 by FDA under an Emergency Use Authorization (EUA).  This EUA will  remain in effect (meaning this test can be used) for the duration of the COVID-19 declaration under Section 564(b)(1) of the Act, 21 U.S.C. section 360bbb-3(b)(1), unless the authorization is terminated or revoked sooner.  Performed at [facility], [place] [address], [place], [place] [address]   Urine culture     Status: None   Collection Time: 02/27/20  1:25 PM   Specimen: Urine, Random  Result Value Ref Range Status   Specimen Description URINE, RANDOM  Final   Special Requests NONE  Final   Culture   Final    NO GROWTH Performed at [facility], [place] [address] [place], [place] [address]    Report Status 02/28/2020 FINAL  Final     Studies: No results found.   Flora Lipps, MD  [facility] 03/03/2020

## 2020-03-04 DIAGNOSIS — K529 Noninfective gastroenteritis and colitis, unspecified: Principal | ICD-10-CM

## 2020-03-04 LAB — COMPREHENSIVE METABOLIC PANEL
ALT: 118 U/L — ABNORMAL HIGH (ref 0–44)
AST: 90 U/L — ABNORMAL HIGH (ref 15–41)
Albumin: 2.4 g/dL — ABNORMAL LOW (ref 3.5–5.0)
Alkaline Phosphatase: 34 U/L — ABNORMAL LOW (ref 38–126)
Anion gap: 11 (ref 5–15)
BUN: 6 mg/dL (ref 6–20)
CO2: 23 mmol/L (ref 22–32)
Calcium: 8.2 mg/dL — ABNORMAL LOW (ref 8.9–10.3)
Chloride: 99 mmol/L (ref 98–111)
Creatinine, Ser: 0.89 mg/dL (ref 0.61–1.24)
GFR calc Af Amer: >60 mL/min (ref >60)
GFR calc non Af Amer: >60 mL/min (ref >60)
Glucose, Bld: 92 mg/dL (ref 70–99)
Potassium: 3.9 mmol/L (ref 3.5–5.1)
Sodium: 133 mmol/L — ABNORMAL LOW (ref 135–145)
Total Bilirubin: 0.6 mg/dL (ref 0.3–1.2)
Total Protein: 5 g/dL — ABNORMAL LOW (ref 6.5–8.1)

## 2020-03-04 LAB — CBC
HCT: 35.4 % — ABNORMAL LOW (ref 39.0–52.0)
Hemoglobin: 11.5 g/dL — ABNORMAL LOW (ref 13.0–17.0)
MCH: 27.6 pg (ref 26.0–34.0)
MCHC: 32.5 g/dL (ref 30.0–36.0)
MCV: 85.1 fL (ref 80.0–100.0)
Platelets: 409 10*3/uL — ABNORMAL HIGH (ref 150–400)
RBC: 4.16 MIL/uL — ABNORMAL LOW (ref 4.22–5.81)
RDW: 16.4 % — ABNORMAL HIGH (ref 11.5–15.5)
WBC: 7 10*3/uL (ref 4.0–10.5)
nRBC: 0 % (ref 0.0–0.2)

## 2020-03-04 LAB — MAGNESIUM: Magnesium: 2 mg/dL (ref 1.7–2.4)

## 2020-03-04 MED ORDER — PANTOPRAZOLE SODIUM 40 MG PO TBEC
40.0000 mg | DELAYED_RELEASE_TABLET | Freq: Every day | ORAL | Status: DC
  Administered 2020-03-04 – 2020-03-06 (×3): 40 mg via ORAL
  Filled 2020-03-04 (×3): qty 1

## 2020-03-04 MED ORDER — DIAZEPAM 5 MG PO TABS
5.0000 mg | ORAL_TABLET | Freq: Two times a day (BID) | ORAL | Status: AC | PRN
  Administered 2020-03-04 – 2020-03-05 (×2): 5 mg via ORAL
  Filled 2020-03-04 (×2): qty 1

## 2020-03-04 MED ORDER — MELOXICAM 7.5 MG PO TABS
15.0000 mg | ORAL_TABLET | Freq: Every day | ORAL | Status: DC
  Administered 2020-03-04 – 2020-03-06 (×3): 15 mg via ORAL
  Filled 2020-03-04 (×3): qty 2

## 2020-03-04 MED ORDER — DICLOFENAC SODIUM 1 % EX GEL
2.0000 g | Freq: Four times a day (QID) | CUTANEOUS | Status: DC
  Administered 2020-03-04 – 2020-03-05 (×4): 2 g via TOPICAL
  Filled 2020-03-04: qty 100

## 2020-03-04 NOTE — Progress Notes (Signed)
PROGRESS NOTE  Christopher Valencia HWT:[id number] DOB: March 26, 1976 DOA: 02/27/2020 PCP: Jilda Panda, MD   LOS: 6 days   Brief narrative: As per HPI,  Christopher Valencia is a 44 y.o. male with medical history significant of paraplegia due to spinal cord injury, neurogenic bladder/bowel, wheelchair bound, chronic constipation presented to emergency department with shaking of body for 10 days with intermittent fever highest up to 1002.3 F..  There was some concern for pulled muscle in the back so he went to [facility] med center where he was prescribed a muscle relaxant.  He went to ED on 9/11 with fever and shaking of his body.  Work-up including UA, chest x-ray came back negative for acute findings therefore he discharged home in stable condition and was prescribed Keflex. He lives with his parents at home.he again presented to hospital this time since he was not getting better.Upon arrival to ED: Patient had fever of 100.1, tachycardic, lactic acid: 2.5, blood pressure noted to be low 80s/50s.  Normally runs on the lower side at home. CRP: 1.3, ESR: 37, COVID-19 negative, UA positive for rare bacteria.  Platelet: 443, AST: 113, ALT: 57, sodium: 127, chest x-ray negative for acute findings.  CT abdomen/pelvis and CT chest was obtained which came back positive for colitis.  Patient received IV fluid boluses, Vanco, cefepime and Flagyl in ED.   Patient was then admitted to hospital for further evaluation and treatment.  Assessment/Plan:  Principal Problem:   Colitis Active Problems:   Neurogenic bowel   Neurogenic bladder   Paraplegia following spinal cord injury ([place])   Hyponatremia   Elevated liver enzymes  Fever, chills with elevated lactate.  Received IV fluids.  No other source of infection so far except for colitis and mild myositis.  Mildly elevated ESR.  T-max of 99.5.  Urinalysis was negative.  CT chest was negative for acute findings.  CT abdomen/pelvis showed colitis.   Hepatitis panel negative.   HIV negative.   MRI of the back showed myositis.  Unclear etiology of fever at this time.   Blood cultures negative so far.  Lower extremity duplex negative for DVT.  Spoke with ID yesterday and antibiotic has been changed to [place] at this time.  Back pain.  Likely lumbar strain/myositis.  We will add analgesic gel, Mobic.  On baclofen, Valium and oxycodone  Mild Colitis:  Initially received Rocephin and Flagyl.  Now on Levaquin and metronidazole as per ID.  Per ID recommendations  Mild rhabdomyolysis.  CK level is elevated at 2500>1863.  Renal function is stable so far.  Patient is bedbound and has myositis on the posterior paraspinal muscles on the MRI scan.  Likely rhabdomyolysis contributing to elevated LFT.  Increase oral fluid intake.  Shaking of body/tremors: Common in patients with paraplegia and could be exacerbated by infection.   Patient did have back pain and MRI was performed since he had fever but only myositis was identified. On baclofen, more tremor on movement of lower extremities.  Mild hypokalemia.  Replenished and improved..  Elevated liver enzymes:   INR within normal limits.  Hepatitis panel was negative.  Possibly from rhabdomyolysis.  Trending down AST ALT.   Hyponatremia: Sodium of 127 on presentation.  Has improved to 133 today.  History of paraplegia following spinal cord injury: Neurogenic bladder/bowel: Unchanged.  DVT prophylaxis: enoxaparin (LOVENOX) injection 40 mg Start: 02/27/20 1845 SCDs Start: 02/27/20 1842    Code Status: Full code  Family Communication:  I  again spoke with the patient's mother on the phone and updated her about the clinical condition of the patient.   Status is: Inpatient  Remains inpatient appropriate because:IV treatments appropriate due to intensity of illness or inability to take PO and Inpatient level of care appropriate due to severity of illness, lactic acidosis, fever, elevated LFT, persistent fever.   Dispo:  The patient is from: Home              Anticipated d/c is to: Home              Anticipated d/c date is: 1 to 2 days              Patient currently is not medically stable to d/c.  Consultants:  Spoke with neurology   Infectious disease  Procedures:  None  Antibiotics:  . Rocephin and metronidazole IV 9/21>9/25 . Flagyl and levaquin 9/26>   Subjective: Today, patient complains of pain on the back.  Did not complain more tremors or shaking.  Fever has improved.  No nausea vomiting or abdominal pain, Shortness of breath cough   Objective: Vitals:   03/04/20 0000 03/04/20 0400  BP: 100/62 105/70  Pulse: (!) 112 100  Resp: 16 18  Temp: 98.5 F (36.9 C) 98.7 F (37.1 C)  SpO2: 100% 95%    Intake/Output Summary (Last 24 hours) at 03/04/2020 0723 Last data filed at 03/04/2020 0430 Gross per 24 hour  Intake 720 ml  Output 3800 ml  Net -3080 ml   Filed Weights   03/01/20 0338 03/02/20 0332 03/03/20 0400  Weight: 91.5 kg 92.5 kg 96 kg   Body mass index is 30.37 kg/m.   Physical Exam:  General:  Average built, not in obvious distress, alert awake oriented HENT:   No scleral pallor or icterus noted. Oral mucosa is moist.  Chest:  Clear breath sounds.  Diminished breath sounds bilaterally. No crackles or wheezes.  CVS: S1 &S2 heard. No murmur.  Regular rate and rhythm. Abdomen: Soft, nontender, nondistended.  Bowel sounds are heard.  External urinary catheter in place.  Tenderness over the lower spine with Extremities: No cyanosis, clubbing or edema.  Paraplegia noted. Psych: Alert, awake and oriented, normal mood   CNS:  No cranial nerve deficits.  Paraplegia noted. Skin: Warm and dry.  No rashes noted.  Data Review: I have personally reviewed the following laboratory data and studies,  CBC: Recent Labs  Lab 02/27/20 1200 02/29/20 0950 03/01/20 1057 03/02/20 0347 03/04/20 0500  WBC 7.9 7.6 6.5 5.4 7.0  NEUTROABS 5.7  --   --   --   --   HGB 12.0* 12.3*  12.0* 11.3* 11.5*  HCT 36.6* 38.0* 37.7* 35.6* 35.4*  MCV 82.8 83.3 85.1 84.6 85.1  PLT 443* 488* 417* 421* 833*   Basic Metabolic Panel: Recent Labs  Lab 02/27/20 1841 02/28/20 1151 02/29/20 0950 03/01/20 1057 03/02/20 0347 03/03/20 0654 03/04/20 0500  NA  --    < > 133* 136 136 134* 133*  K  --    < > 3.9 3.8 3.4* 4.1 3.9  CL  --    < > 99 104 102 102 99  CO2  --    < > 23 22 24 24 23   GLUCOSE  --    < > 102* 116* 102* 85 92  BUN  --    < > 7 6 5* 5* 6  CREATININE  --    < > 1.05 0.74 0.61  0.82 0.89  CALCIUM  --    < > 8.1* 7.7* 7.9* 8.3* 8.2*  MG 2.1  --   --  2.0 2.0  --  2.0  PHOS 2.9  --   --  3.1  --   --   --    < > = values in this interval not displayed.   Liver Function Tests: Recent Labs  Lab 02/27/20 1200 02/28/20 1151 03/01/20 1057 03/02/20 0347 03/04/20 0500  AST 113* 92* 145* 107* 90*  ALT 57* 52* 116* 117* 118*  ALKPHOS 32* 31* 36* 36* 34*  BILITOT 0.6 0.5 0.6 0.3 0.6  PROT 6.0* 5.8* 5.5* 5.2* 5.0*  ALBUMIN 3.0* 2.9* 2.7* 2.5* 2.4*   Recent Labs  Lab 02/27/20 1744  LIPASE 45   Recent Labs  Lab 02/27/20 1925  AMMONIA 20   Cardiac Enzymes: Recent Labs  Lab 02/29/20 1522 03/01/20 1057 03/02/20 0347  CKTOTAL 2,573* 2,446* 1,893*   BNP (last 3 results) No results for input(s): BNP in the last 8760 hours.  ProBNP (last 3 results) No results for input(s): PROBNP in the last 8760 hours.  CBG: No results for input(s): GLUCAP in the last 168 hours. Recent Results (from the past 240 hour(s))  Culture, blood (routine x 2)     Status: None   Collection Time: 02/27/20 11:36 AM   Specimen: BLOOD  Result Value Ref Range Status   Specimen Description BLOOD BLOOD LEFT HAND  Final   Special Requests   Final    BOTTLES DRAWN AEROBIC AND ANAEROBIC Blood Culture adequate volume   Culture   Final    NO GROWTH 5 DAYS Performed at [facility], [address], [place], [place] [address]    Report Status 03/03/2020 FINAL  Final  Culture,  blood (routine x 2)     Status: None   Collection Time: 02/27/20 12:29 PM   Specimen: BLOOD  Result Value Ref Range Status   Specimen Description BLOOD BLOOD RIGHT HAND  Final   Special Requests   Final    BOTTLES DRAWN AEROBIC AND ANAEROBIC Blood Culture results may not be optimal due to an excessive volume of blood received in culture bottles   Culture   Final    NO GROWTH 5 DAYS Performed at [facility], [place] [address], [place], [place] [address]    Report Status 03/03/2020 FINAL  Final  SARS Coronavirus 2 by RT PCR (hospital order, performed in [facility] hospital lab) Nasopharyngeal Nasopharyngeal Swab     Status: None   Collection Time: 02/27/20 12:52 PM   Specimen: Nasopharyngeal Swab  Result Value Ref Range Status   SARS Coronavirus 2 NEGATIVE NEGATIVE Final    Comment: (NOTE) SARS-CoV-2 target nucleic acids are NOT DETECTED.  The SARS-CoV-2 RNA is generally detectable in upper and lower respiratory specimens during the acute phase of infection. The lowest concentration of SARS-CoV-2 viral copies this assay can detect is 250 copies / mL. A negative result does not preclude SARS-CoV-2 infection and should not be used as the sole basis for treatment or other patient management decisions.  A negative result may occur with improper specimen collection / handling, submission of specimen other than nasopharyngeal swab, presence of viral mutation(s) within the areas targeted by this assay, and inadequate number of viral copies (<250 copies / mL). A negative result must be combined with clinical observations, patient history, and epidemiological information.  Fact Sheet for Patients:   StrictlyIdeas.no  Fact Sheet  for Healthcare Providers: BankingDealers.co.za  This test is not yet approved or  cleared by the [place] and has been authorized for detection and/or diagnosis of SARS-CoV-2 by FDA under an Emergency Use  Authorization (EUA).  This EUA will remain in effect (meaning this test can be used) for the duration of the COVID-19 declaration under Section 564(b)(1) of the Act, 21 U.S.C. section 360bbb-3(b)(1), unless the authorization is terminated or revoked sooner.  Performed at [facility], [place] [address] [place], [place] [address]   Urine culture     Status: None   Collection Time: 02/27/20  1:25 PM   Specimen: Urine, Random  Result Value Ref Range Status   Specimen Description URINE, RANDOM  Final   Special Requests NONE  Final   Culture   Final    NO GROWTH Performed at [facility], [place] [address], [place], [place] [address]    Report Status 02/28/2020 FINAL  Final     Studies: VAS [place] LOWER EXTREMITY VENOUS (DVT)  Result Date: 03/04/2020  Lower Venous DVTStudy Indications: Fever, and Edema.  Limitations: Paraplegia, rigors, edema. Comparison Study: No prior study on file Performing Technologist: Sharion Dove RVS  Examination Guidelines: A complete evaluation includes B-mode imaging, spectral Doppler, color Doppler, and power Doppler as needed of all accessible portions of each vessel. Bilateral testing is considered an integral part of a complete examination. Limited examinations for reoccurring indications may be performed as noted. The reflux portion of the exam is performed with the patient in reverse Trendelenburg.  +---------+---------------+---------+-----------+----------+--------------+ RIGHT    CompressibilityPhasicitySpontaneityPropertiesThrombus Aging +---------+---------------+---------+-----------+----------+--------------+ CFV      Full           Yes      Yes                                 +---------+---------------+---------+-----------+----------+--------------+ SFJ      Full                                                        +---------+---------------+---------+-----------+----------+--------------+ FV Prox  Full                                                         +---------+---------------+---------+-----------+----------+--------------+ FV Mid   Full                                                        +---------+---------------+---------+-----------+----------+--------------+ FV DistalFull                                                        +---------+---------------+---------+-----------+----------+--------------+ PFV      Full                                                        +---------+---------------+---------+-----------+----------+--------------+  POP      Full                                                        +---------+---------------+---------+-----------+----------+--------------+ PTV                                                   Not visualized +---------+---------------+---------+-----------+----------+--------------+ PERO                                                  Not visualized +---------+---------------+---------+-----------+----------+--------------+   +---------+---------------+---------+-----------+----------+-------------------+ LEFT     CompressibilityPhasicitySpontaneityPropertiesThrombus Aging      +---------+---------------+---------+-----------+----------+-------------------+ CFV      Full           Yes      Yes                                      +---------+---------------+---------+-----------+----------+-------------------+ SFJ      Full                                                             +---------+---------------+---------+-----------+----------+-------------------+ FV Prox  Full                                                             +---------+---------------+---------+-----------+----------+-------------------+ FV Mid   Full                                                             +---------+---------------+---------+-----------+----------+-------------------+ FV DistalFull                                                              +---------+---------------+---------+-----------+----------+-------------------+ PFV      Full                                                             +---------+---------------+---------+-----------+----------+-------------------+ POP  patent by color and                                                       Doppler             +---------+---------------+---------+-----------+----------+-------------------+ PTV                                                   Not visualized      +---------+---------------+---------+-----------+----------+-------------------+ PERO                                                  Not visualized      +---------+---------------+---------+-----------+----------+-------------------+     Summary: RIGHT: - There is no evidence of deep vein thrombosis in the lower extremity. However, portions of this examination were limited- see technologist comments above.  LEFT: - There is no evidence of deep vein thrombosis in the lower extremity. However, portions of this examination were limited- see technologist comments above.  *See table(s) above for measurements and observations. Electronically signed by Deitra Mayo MD on 03/04/2020 at 7:03:03 AM.    Final      Flora Lipps, MD  [facility] 03/04/2020

## 2020-03-04 NOTE — Progress Notes (Signed)
[facility]  Date of Admission:  02/27/2020     Total days of antibiotics 7         ASSESSMENT:  Mr. Shedd has remained afebrile over the last 48 hours and without antipyrotic medication. Clinically is better today now complaining of a pulled muscle feeling in his back which is likely from the mysoitis. Will plan on completing antibiotic therapy today with levofloxacin and metronidazole for colitis. Discussed to monitor for worsening pain back pain which would be a trigger for need of additional imaging to ensure no discitis. ID will sign off for now.  PLAN:  1. Continue levofloxacin and metronidazole through today for colitis.  2. Monitor for worsening back pain.  ID will sign off.   Principal Problem:   Colitis Active Problems:   Neurogenic bowel   Neurogenic bladder   Paraplegia following spinal cord injury ([facility])   Hyponatremia   Elevated liver enzymes   . baclofen  10 mg Oral TID  . bisacodyl  10 mg Rectal Daily  . diclofenac Sodium  2 g Topical QID  . enoxaparin (LOVENOX) injection  40 mg Subcutaneous Q24H  . levofloxacin  750 mg Oral Q24H  . meloxicam  15 mg Oral Daily  . metroNIDAZOLE  500 mg Oral Q8H  . pantoprazole  40 mg Oral Daily    SUBJECTIVE:  Afebrile overnight with no acute events. Feeling better today with feelings over fevers and chills gone. Now with low back pain that feels like a pulled muscle.   No Known Allergies   Review of Systems: Review of Systems  Constitutional: Negative for chills, fever and weight loss.  Respiratory: Negative for cough, shortness of breath and wheezing.   Cardiovascular: Negative for chest pain and leg swelling.  Gastrointestinal: Negative for abdominal pain, constipation, diarrhea, nausea and vomiting.  Musculoskeletal: Positive for back pain.  Skin: Negative for rash.      OBJECTIVE: Vitals:   03/04/20 0000 03/04/20 0400 03/04/20 0746 03/04/20 0855  BP: 100/62 105/70 (!)  125/105   Pulse: (!) 112 100    Resp: 16 18    Temp: 98.5 F (36.9 C) 98.7 F (37.1 C) 98.5 F (36.9 C)   TempSrc: Oral Axillary Oral   SpO2: 100% 95%    Weight:    89.2 kg  Height:       Body mass index is 28.22 kg/m.  Physical Exam Constitutional:      General: He is not in acute distress.    Appearance: He is well-developed.  Cardiovascular:     Rate and Rhythm: Regular rhythm. Tachycardia present.     Heart sounds: Normal heart sounds.  Pulmonary:     Effort: Pulmonary effort is normal.     Breath sounds: Normal breath sounds.  Skin:    General: Skin is warm and dry.  Neurological:     Mental Status: He is alert and oriented to person, place, and time.  Psychiatric:        Behavior: Behavior normal.        Thought Content: Thought content normal.        Judgment: Judgment normal.     Lab Results Lab Results  Component Value Date   WBC 7.0 03/04/2020   HGB 11.5 (L) 03/04/2020   HCT 35.4 (L) 03/04/2020   MCV 85.1 03/04/2020   PLT 409 (H) 03/04/2020    Lab Results  Component Value Date   CREATININE 0.89 03/04/2020   BUN 6  03/04/2020   NA 133 (L) 03/04/2020   K 3.9 03/04/2020   CL 99 03/04/2020   CO2 23 03/04/2020    Lab Results  Component Value Date   ALT 118 (H) 03/04/2020   AST 90 (H) 03/04/2020   ALKPHOS 34 (L) 03/04/2020   BILITOT 0.6 03/04/2020     Microbiology: Recent Results (from the past 240 hour(s))  Culture, blood (routine x 2)     Status: None   Collection Time: 02/27/20 11:36 AM   Specimen: BLOOD  Result Value Ref Range Status   Specimen Description BLOOD BLOOD LEFT HAND  Final   Special Requests   Final    BOTTLES DRAWN AEROBIC AND ANAEROBIC Blood Culture adequate volume   Culture   Final    NO GROWTH 5 DAYS Performed at [facility], [address] [place], [place] [address]    Report Status 03/03/2020 FINAL  Final  Culture, blood (routine x 2)     Status: None   Collection Time: 02/27/20 12:29 PM   Specimen: BLOOD    Result Value Ref Range Status   Specimen Description BLOOD BLOOD RIGHT HAND  Final   Special Requests   Final    BOTTLES DRAWN AEROBIC AND ANAEROBIC Blood Culture results may not be optimal due to an excessive volume of blood received in culture bottles   Culture   Final    NO GROWTH 5 DAYS Performed at [facility], [address] [place], [place] [address]    Report Status 03/03/2020 FINAL  Final  SARS Coronavirus 2 by RT PCR (hospital order, performed in [facility] hospital lab) Nasopharyngeal Nasopharyngeal Swab     Status: None   Collection Time: 02/27/20 12:52 PM   Specimen: Nasopharyngeal Swab  Result Value Ref Range Status   SARS Coronavirus 2 NEGATIVE NEGATIVE Final    Comment: (NOTE) SARS-CoV-2 target nucleic acids are NOT DETECTED.  The SARS-CoV-2 RNA is generally detectable in upper and lower respiratory specimens during the acute phase of infection. The lowest concentration of SARS-CoV-2 viral copies this assay can detect is 250 copies / mL. A negative result does not preclude SARS-CoV-2 infection and should not be used as the sole basis for treatment or other patient management decisions.  A negative result may occur with improper specimen collection / handling, submission of specimen other than nasopharyngeal swab, presence of viral mutation(s) within the areas targeted by this assay, and inadequate number of viral copies (<250 copies / mL). A negative result must be combined with clinical observations, patient history, and epidemiological information.  Fact Sheet for Patients:   BoilerBrush.com.cy  Fact Sheet for Healthcare Providers: https://pope.com/  This test is not yet approved or  cleared by the [place] FDA and has been authorized for detection and/or diagnosis of SARS-CoV-2 by FDA under an Emergency Use Authorization (EUA).  This EUA will remain in effect (meaning this test can be used) for the  duration of the COVID-19 declaration under Section 564(b)(1) of the Act, 21 U.S.C. section 360bbb-3(b)(1), unless the authorization is terminated or revoked sooner.  Performed at [facility], [address] [place], [place] [address]   Urine culture     Status: None   Collection Time: 02/27/20  1:25 PM   Specimen: Urine, Random  Result Value Ref Range Status   Specimen Description URINE, RANDOM  Final   Special Requests NONE  Final   Culture   Final    NO GROWTH Performed  at [facility], [address], [place], [place] [address]    Report Status 02/28/2020 FINAL  Final     Marcos Eke, NP [facility]  03/04/2020  11:04 AM

## 2020-03-05 ENCOUNTER — Telehealth: Payer: Self-pay | Admitting: Physical Medicine and Rehabilitation

## 2020-03-05 ENCOUNTER — Encounter (HOSPITAL_COMMUNITY): Payer: Self-pay | Admitting: Internal Medicine

## 2020-03-05 DIAGNOSIS — R600 Localized edema: Secondary | ICD-10-CM

## 2020-03-05 DIAGNOSIS — G904 Autonomic dysreflexia: Secondary | ICD-10-CM

## 2020-03-05 LAB — COMPREHENSIVE METABOLIC PANEL
ALT: 90 U/L — ABNORMAL HIGH (ref 0–44)
AST: 51 U/L — ABNORMAL HIGH (ref 15–41)
Albumin: 2.3 g/dL — ABNORMAL LOW (ref 3.5–5.0)
Alkaline Phosphatase: 34 U/L — ABNORMAL LOW (ref 38–126)
Anion gap: 8 (ref 5–15)
BUN: 6 mg/dL (ref 6–20)
CO2: 24 mmol/L (ref 22–32)
Calcium: 8.1 mg/dL — ABNORMAL LOW (ref 8.9–10.3)
Chloride: 103 mmol/L (ref 98–111)
Creatinine, Ser: 0.8 mg/dL (ref 0.61–1.24)
GFR calc Af Amer: >60 mL/min (ref >60)
GFR calc non Af Amer: >60 mL/min (ref >60)
Glucose, Bld: 101 mg/dL — ABNORMAL HIGH (ref 70–99)
Potassium: 3.7 mmol/L (ref 3.5–5.1)
Sodium: 135 mmol/L (ref 135–145)
Total Bilirubin: 0.3 mg/dL (ref 0.3–1.2)
Total Protein: 4.9 g/dL — ABNORMAL LOW (ref 6.5–8.1)

## 2020-03-05 LAB — CBC
HCT: 34.3 % — ABNORMAL LOW (ref 39.0–52.0)
Hemoglobin: 11 g/dL — ABNORMAL LOW (ref 13.0–17.0)
MCH: 27.6 pg (ref 26.0–34.0)
MCHC: 32.1 g/dL (ref 30.0–36.0)
MCV: 86.2 fL (ref 80.0–100.0)
Platelets: 364 10*3/uL (ref 150–400)
RBC: 3.98 MIL/uL — ABNORMAL LOW (ref 4.22–5.81)
RDW: 16.8 % — ABNORMAL HIGH (ref 11.5–15.5)
WBC: 5.3 10*3/uL (ref 4.0–10.5)
nRBC: 0 % (ref 0.0–0.2)

## 2020-03-05 LAB — CK: Total CK: 904 U/L — ABNORMAL HIGH (ref 49–397)

## 2020-03-05 MED ORDER — BACLOFEN 10 MG PO TABS: 10.0000 mg | ORAL_TABLET | Freq: Three times a day (TID) | ORAL | 0 refills | Status: DC

## 2020-03-05 MED ORDER — GABAPENTIN 100 MG PO CAPS: 100.0000 mg | ORAL_CAPSULE | Freq: Three times a day (TID) | ORAL | 1 refills | Status: DC

## 2020-03-05 MED ORDER — PANTOPRAZOLE SODIUM 40 MG PO TBEC
40.0000 mg | DELAYED_RELEASE_TABLET | Freq: Every day | ORAL | 0 refills | Status: DC
Start: 2020-03-06 — End: 2020-08-26

## 2020-03-05 MED ORDER — CYPROHEPTADINE HCL 4 MG PO TABS
2.0000 mg | ORAL_TABLET | Freq: Three times a day (TID) | ORAL | Status: DC
  Administered 2020-03-05 – 2020-03-06 (×4): 2 mg via ORAL
  Filled 2020-03-05 (×5): qty 1

## 2020-03-05 MED ORDER — MELOXICAM 15 MG PO TABS
15.0000 mg | ORAL_TABLET | Freq: Every day | ORAL | 0 refills | Status: DC
Start: 2020-03-06 — End: 2020-03-20

## 2020-03-05 MED ORDER — DIAZEPAM 5 MG PO TABS: 5.0000 mg | ORAL_TABLET | Freq: Two times a day (BID) | ORAL | 0 refills | Status: DC | PRN

## 2020-03-05 NOTE — Progress Notes (Signed)
PROGRESS NOTE  Christopher Valencia NLG:[id number] DOB: 09/09/1975 DOA: 02/27/2020 PCP: Jilda Panda, MD   LOS: 7 days   Brief narrative: As per HPI,  Christopher Valencia is a 44 y.o. male with medical history significant of paraplegia due to spinal cord injury, neurogenic bladder/bowel, wheelchair bound, chronic constipation presented to emergency department with shaking of body for 10 days with intermittent fever highest up to 1002.3 F..  There was some concern for pulled muscle in the back so he went to [facility] med center where he was prescribed a muscle relaxant.  He went to ED on 9/11 with fever and shaking of his body.  Work-up including UA, chest x-ray came back negative for acute findings therefore he discharged home in stable condition and was prescribed Keflex. He lives with his parents at home.he again presented to hospital this time since he was not getting better.Upon arrival to ED: Patient had fever of 100.1, tachycardic, lactic acid: 2.5, blood pressure noted to be low 80s/50s.  Normally runs on the lower side at home. CRP: 1.3, ESR: 37, COVID-19 negative, UA positive for rare bacteria.  Platelet: 443, AST: 113, ALT: 57, sodium: 127, chest x-ray negative for acute findings.  CT abdomen/pelvis and CT chest was obtained which came back positive for colitis.  Patient received IV fluid boluses, Vanco, cefepime and Flagyl in ED.   Patient was then admitted to hospital for further evaluation and treatment.  Assessment/Plan:  Principal Problem:   Colitis Active Problems:   Neurogenic bowel   Neurogenic bladder   Paraplegia following spinal cord injury ([place])   Hyponatremia   Elevated liver enzymes  Fever, chills with elevated lactate.  Received IV fluids.  No other source of infection so far except for colitis and mild myositis.  Mildly elevated ESR.  T-max of 99.5.  Urinalysis was negative.  CT chest was negative for acute findings.  CT abdomen/pelvis showed colitis.   Hepatitis panel negative.   HIV negative.   MRI of the back showed myositis.    Blood cultures negative so far.  Lower extremity duplex negative for DVT. Seen by ID antibiotic has been changed to [place] at this time to complete total 7 day course  of antibiotic.   Back pain.  Likely lumbar strain/myositis.  Added analgesic gel, Mobic.  On baclofen, Valium and oxycodone.    Shaking of body/tremors: Common in patients with paraplegia and could be exacerbated by infection.   Patient did have back pain and MRI was performed since he had fever but only myositis was identified.   I had previously spoken with neurology who had recommended baclofen.  Due to persistent tremors, patient and the family wish further evaluation. Spoke with neurology for consultation since patient is extremely worried about his tremors including his mother due to his spine issues.  Patient and the patient's caretaker mom at bedside do not feel comfortable about going home today.  Mild Colitis:  Initially received Rocephin and Flagyl.  Now on Levaquin and metronidazole as per ID to complete the course.  Mild rhabdomyolysis.  CK level is elevated at [phone].  Renal function is stable so far.  Patient is bedbound and has myositis on the posterior paraspinal muscles on the MRI scan.  Likely rhabdomyolysis contributing to elevated LFT.  Increase oral fluid intake.  Mild hypokalemia.  Replenished and improved..  Elevated liver enzymes:   INR within normal limits.  Hepatitis panel was negative.  Possibly from rhabdomyolysis.  Trending down AST  ALT.   Hyponatremia: Resolved.  Sodium of 135.  History of paraplegia following spinal cord injury: Neurogenic bladder/bowel: Unchanged.  DVT prophylaxis: enoxaparin (LOVENOX) injection 40 mg Start: 02/27/20 1845 SCDs Start: 02/27/20 1842    Code Status: Full code  Family Communication:  I spoke with the patient's mother at bedside   Status is: Inpatient  Remains inpatient appropriate  because:IV treatments appropriate due to intensity of illness or inability to take PO and Inpatient level of care appropriate due to severity of illness, persistent tremors, neurology evaluation  Dispo: The patient is from: Home              Anticipated d/c is to: Home               Anticipated d/c date is: 1 to 2 days              Patient currently is not medically stable to d/c.  Consultants:  neurology   Infectious disease  Procedures:  None  Antibiotics:  . Rocephin and metronidazole IV 9/21>9/25 . Flagyl and levaquin 9/26>   Subjective: Today, patient again complained of extremity tremors and flushing of his body.  Patient's mother at bedside.  Initially patient was planned for discharge home but had recurrence of lower extremity tremors.  Objective: Vitals:   03/05/20 0345 03/05/20 0717  BP: 119/90   Pulse: 82 86  Resp: 20   Temp: 97.9 F (36.6 C) 98.1 F (36.7 C)  SpO2: 100% 97%    Intake/Output Summary (Last 24 hours) at 03/05/2020 1606 Last data filed at 03/05/2020 1357 Gross per 24 hour  Intake 880 ml  Output 1975 ml  Net -1095 ml   Filed Weights   03/03/20 0400 03/04/20 0855 03/05/20 0003  Weight: 96 kg 89.2 kg 89.9 kg   Body mass index is 28.44 kg/m.   Physical Exam:  General:  Average built, not in obvious distress, alert awake oriented HENT:   No scleral pallor or icterus noted. Oral mucosa is moist.  Chest:  Clear breath sounds.  Diminished breath sounds bilaterally. No crackles or wheezes.  CVS: S1 &S2 heard. No murmur.  Regular rate and rhythm. Abdomen: Soft, nontender, nondistended.  Bowel sounds are heard.  External urinary catheter in place.  Tenderness over the lower spine with mild paravertebral spasm. Extremities: No cyanosis, clubbing or edema.  Paraplegia noted.  Involuntary tremors noted of the bilateral lower extremities. Psych: Alert, awake and oriented, normal mood   CNS:  No cranial nerve deficits.  Paraplegia noted. Skin: Warm  and dry.  No rashes noted.  Data Review: I have personally reviewed the following laboratory data and studies,  CBC: Recent Labs  Lab 02/29/20 0950 03/01/20 1057 03/02/20 0347 03/04/20 0500 03/05/20 0426  WBC 7.6 6.5 5.4 7.0 5.3  HGB 12.3* 12.0* 11.3* 11.5* 11.0*  HCT 38.0* 37.7* 35.6* 35.4* 34.3*  MCV 83.3 85.1 84.6 85.1 86.2  PLT 488* 417* 421* 409* 878   Basic Metabolic Panel: Recent Labs  Lab 02/27/20 1841 02/28/20 1151 03/01/20 1057 03/02/20 0347 03/03/20 0654 03/04/20 0500 03/05/20 0426  NA  --    < > 136 136 134* 133* 135  K  --    < > 3.8 3.4* 4.1 3.9 3.7  CL  --    < > 104 102 102 99 103  CO2  --    < > 22 24 24 23 24   GLUCOSE  --    < > 116* 102* 85 92 101*  BUN  --    < > 6 5* 5* 6 6  CREATININE  --    < > 0.74 0.61 0.82 0.89 0.80  CALCIUM  --    < > 7.7* 7.9* 8.3* 8.2* 8.1*  MG 2.1  --  2.0 2.0  --  2.0  --   PHOS 2.9  --  3.1  --   --   --   --    < > = values in this interval not displayed.   Liver Function Tests: Recent Labs  Lab 02/28/20 1151 03/01/20 1057 03/02/20 0347 03/04/20 0500 03/05/20 0426  AST 92* 145* 107* 90* 51*  ALT 52* 116* 117* 118* 90*  ALKPHOS 31* 36* 36* 34* 34*  BILITOT 0.5 0.6 0.3 0.6 0.3  PROT 5.8* 5.5* 5.2* 5.0* 4.9*  ALBUMIN 2.9* 2.7* 2.5* 2.4* 2.3*   Recent Labs  Lab 02/27/20 1744  LIPASE 45   Recent Labs  Lab 02/27/20 1925  AMMONIA 20   Cardiac Enzymes: Recent Labs  Lab 02/29/20 1522 03/01/20 1057 03/02/20 0347 03/05/20 0426  CKTOTAL 2,573* 2,446* 1,893* 904*   BNP (last 3 results) No results for input(s): BNP in the last 8760 hours.  ProBNP (last 3 results) No results for input(s): PROBNP in the last 8760 hours.  CBG: No results for input(s): GLUCAP in the last 168 hours. Recent Results (from the past 240 hour(s))  Culture, blood (routine x 2)     Status: None   Collection Time: 02/27/20 11:36 AM   Specimen: BLOOD  Result Value Ref Range Status   Specimen Description BLOOD BLOOD LEFT HAND   Final   Special Requests   Final    BOTTLES DRAWN AEROBIC AND ANAEROBIC Blood Culture adequate volume   Culture   Final    NO GROWTH 5 DAYS Performed at [facility], [address] [place], [place] [address]    Report Status 03/03/2020 FINAL  Final  Culture, blood (routine x 2)     Status: None   Collection Time: 02/27/20 12:29 PM   Specimen: BLOOD  Result Value Ref Range Status   Specimen Description BLOOD BLOOD RIGHT HAND  Final   Special Requests   Final    BOTTLES DRAWN AEROBIC AND ANAEROBIC Blood Culture results may not be optimal due to an excessive volume of blood received in culture bottles   Culture   Final    NO GROWTH 5 DAYS Performed at [facility], [place] [address], [place], [place] [address]    Report Status 03/03/2020 FINAL  Final  SARS Coronavirus 2 by RT PCR (hospital order, performed in [facility] hospital lab) Nasopharyngeal Nasopharyngeal Swab     Status: None   Collection Time: 02/27/20 12:52 PM   Specimen: Nasopharyngeal Swab  Result Value Ref Range Status   SARS Coronavirus 2 NEGATIVE NEGATIVE Final    Comment: (NOTE) SARS-CoV-2 target nucleic acids are NOT DETECTED.  The SARS-CoV-2 RNA is generally detectable in upper and lower respiratory specimens during the acute phase of infection. The lowest concentration of SARS-CoV-2 viral copies this assay can detect is 250 copies / mL. A negative result does not preclude SARS-CoV-2 infection and should not be used as the sole basis for treatment or other patient management decisions.  A negative result may occur with improper specimen collection / handling, submission of specimen other than nasopharyngeal swab, presence of viral mutation(s) within the areas targeted by this assay, and inadequate number of viral copies (<250  copies / mL). A negative result must be combined with clinical observations, patient history, and epidemiological information.  Fact Sheet for Patients:     StrictlyIdeas.no  Fact Sheet for Healthcare Providers: BankingDealers.co.za  This test is not yet approved or  cleared by the [place] FDA and has been authorized for detection and/or diagnosis of SARS-CoV-2 by FDA under an Emergency Use Authorization (EUA).  This EUA will remain in effect (meaning this test can be used) for the duration of the COVID-19 declaration under Section 564(b)(1) of the Act, 21 U.S.C. section 360bbb-3(b)(1), unless the authorization is terminated or revoked sooner.  Performed at [facility], [place] [address], [place], [place] [address]   Urine culture     Status: None   Collection Time: 02/27/20  1:25 PM   Specimen: Urine, Random  Result Value Ref Range Status   Specimen Description URINE, RANDOM  Final   Special Requests NONE  Final   Culture   Final    NO GROWTH Performed at [facility], [place] [address] [place], [place] [address]    Report Status 02/28/2020 FINAL  Final     Studies: VAS [place] LOWER EXTREMITY VENOUS (DVT)  Result Date: 03/04/2020  Lower Venous DVTStudy Indications: Fever, and Edema.  Limitations: Paraplegia, rigors, edema. Comparison Study: No prior study on file Performing Technologist: Sharion Dove RVS  Examination Guidelines: A complete evaluation includes B-mode imaging, spectral Doppler, color Doppler, and power Doppler as needed of all accessible portions of each vessel. Bilateral testing is considered an integral part of a complete examination. Limited examinations for reoccurring indications may be performed as noted. The reflux portion of the exam is performed with the patient in reverse Trendelenburg.  +---------+---------------+---------+-----------+----------+--------------+ RIGHT    CompressibilityPhasicitySpontaneityPropertiesThrombus Aging +---------+---------------+---------+-----------+----------+--------------+ CFV      Full           Yes      Yes                                  +---------+---------------+---------+-----------+----------+--------------+ SFJ      Full                                                        +---------+---------------+---------+-----------+----------+--------------+ FV Prox  Full                                                        +---------+---------------+---------+-----------+----------+--------------+ FV Mid   Full                                                        +---------+---------------+---------+-----------+----------+--------------+ FV DistalFull                                                        +---------+---------------+---------+-----------+----------+--------------+  PFV      Full                                                        +---------+---------------+---------+-----------+----------+--------------+ POP      Full                                                        +---------+---------------+---------+-----------+----------+--------------+ PTV                                                   Not visualized +---------+---------------+---------+-----------+----------+--------------+ PERO                                                  Not visualized +---------+---------------+---------+-----------+----------+--------------+   +---------+---------------+---------+-----------+----------+-------------------+ LEFT     CompressibilityPhasicitySpontaneityPropertiesThrombus Aging      +---------+---------------+---------+-----------+----------+-------------------+ CFV      Full           Yes      Yes                                      +---------+---------------+---------+-----------+----------+-------------------+ SFJ      Full                                                             +---------+---------------+---------+-----------+----------+-------------------+ FV Prox  Full                                                              +---------+---------------+---------+-----------+----------+-------------------+ FV Mid   Full                                                             +---------+---------------+---------+-----------+----------+-------------------+ FV DistalFull                                                             +---------+---------------+---------+-----------+----------+-------------------+ PFV      Full                                                             +---------+---------------+---------+-----------+----------+-------------------+  POP                                                   patent by color and                                                       Doppler             +---------+---------------+---------+-----------+----------+-------------------+ PTV                                                   Not visualized      +---------+---------------+---------+-----------+----------+-------------------+ PERO                                                  Not visualized      +---------+---------------+---------+-----------+----------+-------------------+     Summary: RIGHT: - There is no evidence of deep vein thrombosis in the lower extremity. However, portions of this examination were limited- see technologist comments above.  LEFT: - There is no evidence of deep vein thrombosis in the lower extremity. However, portions of this examination were limited- see technologist comments above.  *See table(s) above for measurements and observations. Electronically signed by Deitra Mayo MD on 03/04/2020 at 7:03:03 AM.    Final      Flora Lipps, MD  [facility] 03/05/2020

## 2020-03-05 NOTE — Consult Note (Addendum)
Neurology Consultation  Reason for Consult: Spasticity of lower extremities Referring Physician: Joycelyn Das, MD  CC: New onset spasticity of lower extremities  History is obtained from: Patient  HPI: Christopher Valencia is a 44 y.o. male with history of spinal cord injury, C5-C7 along with paraplegia following spinal cord injury.  Over the past 2 weeks patient states that he has been having increased spasticity and shaking of the lower extremities.  He does state that this did seem to start when he was pulling on the fridge rater door and he felt a pop in his lower back.  It is associated with any manipulation such as moving him in the bed.  He states that he notes when it does occur he will have increased heart rate, increased blood pressure and become diaphoretic.  Patient states while in hospital they have tried Valium with no resolution, Zanaflex with no resolution.  He does state that the oxycodone does seem to help.  Neurology was consulted secondary to his spasms not resolving and for further recommendations on any medication that could help resolve patient's spasms.  Past Medical History:  Diagnosis Date  . Paraplegia following spinal cord injury (HCC)   . Spinal cord injury, C5-C7 (HCC)    Family History  Problem Relation Age of Onset  . Hypertension Mother   . Hypertension Father    Social History:   reports that he has never smoked. He has never used smokeless tobacco. He reports that he does not drink alcohol and does not use drugs.  Medications  Current Facility-Administered Medications:  .  0.9 %  sodium chloride infusion, , Intravenous, PRN, Pahwani, Kasandra Knudsen, MD, Stopped at 02/29/20 0944 .  acetaminophen (TYLENOL) tablet 650 mg, 650 mg, Oral, Q6H PRN, 650 mg at 02/28/20 1707 **OR** acetaminophen (TYLENOL) suppository 650 mg, 650 mg, Rectal, Q6H PRN, Pahwani, Rinka R, MD .  alum & mag hydroxide-simeth (MAALOX/MYLANTA) 200-200-20 MG/5ML suspension 30 mL, 30 mL, Oral, Q4H PRN,  Pokhrel, Laxman, MD, 30 mL at 03/02/20 0300 .  baclofen (LIORESAL) tablet 10 mg, 10 mg, Oral, TID, Pokhrel, Laxman, MD, 10 mg at 03/05/20 0847 .  bisacodyl (DULCOLAX) suppository 10 mg, 10 mg, Rectal, Daily, Pokhrel, Laxman, MD, 10 mg at 03/05/20 0655 .  diclofenac Sodium (VOLTAREN) 1 % topical gel 2 g, 2 g, Topical, QID, Pokhrel, Laxman, MD, 2 g at 03/05/20 0100 .  enoxaparin (LOVENOX) injection 40 mg, 40 mg, Subcutaneous, Q24H, Pahwani, Rinka R, MD, 40 mg at 03/04/20 1928 .  hydrALAZINE (APRESOLINE) injection 10 mg, 10 mg, Intravenous, Q6H PRN, Pokhrel, Laxman, MD, 10 mg at 03/03/20 1506 .  labetalol (NORMODYNE) injection 10 mg, 10 mg, Intravenous, Q2H PRN, Opyd, Lavone Neri, MD .  lip balm (CARMEX) ointment, , Topical, PRN, Pokhrel, Laxman, MD .  meloxicam (MOBIC) tablet 15 mg, 15 mg, Oral, Daily, Pokhrel, Laxman, MD, 15 mg at 03/05/20 0847 .  ondansetron (ZOFRAN) tablet 4 mg, 4 mg, Oral, Q6H PRN **OR** ondansetron (ZOFRAN) injection 4 mg, 4 mg, Intravenous, Q6H PRN, Pahwani, Rinka R, MD, 4 mg at 03/02/20 0100 .  oxyCODONE (Oxy IR/ROXICODONE) immediate release tablet 5 mg, 5 mg, Oral, Q4H PRN, Pahwani, Rinka R, MD, 5 mg at 03/04/20 0858 .  pantoprazole (PROTONIX) EC tablet 40 mg, 40 mg, Oral, Daily, Pokhrel, Laxman, MD, 40 mg at 03/05/20 0847  ROS: .   General ROS: negative for - chills, fatigue, fever, night sweats, weight gain or weight loss Psychological ROS: negative for - behavioral disorder, hallucinations,  memory difficulties, mood swings or suicidal ideation Ophthalmic ROS: negative for - blurry vision, double vision, eye pain or loss of vision ENT ROS: negative for - epistaxis, nasal discharge, oral lesions, sore throat, tinnitus or vertigo Allergy and Immunology ROS: negative for - hives or itchy/watery eyes Hematological and Lymphatic ROS: negative for - bleeding problems, bruising or swollen lymph nodes Endocrine ROS: negative for - galactorrhea, hair pattern changes,  polydipsia/polyuria or temperature intolerance Respiratory ROS: negative for - cough, hemoptysis, shortness of breath or wheezing Cardiovascular ROS: negative for - chest pain, dyspnea on exertion, edema or irregular heartbeat Gastrointestinal ROS: Positive for - abdominal pain Genito-Urinary ROS: negative for - dysuria, hematuria, incontinence or urinary frequency/urgency Musculoskeletal ROS: Positive for -muscular pain Neurological ROS: as noted in HPI Dermatological ROS: negative for rash and skin lesion changes  Exam: Current vital signs: BP 119/90   Pulse 86   Temp 98.1 F (36.7 C) (Oral)   Resp 20   Ht 5\' 10"  (1.778 m)   Wt 89.9 kg   SpO2 97%   BMI 28.44 kg/m  Vital signs in last 24 hours: Temp:  [97.5 F (36.4 C)-98.7 F (37.1 C)] 98.1 F (36.7 C) (09/28 0717) Pulse Rate:  [82-86] 86 (09/28 0717) Resp:  [20] 20 (09/28 0345) BP: (90-119)/(61-90) 119/90 (09/28 0345) SpO2:  [97 %-100 %] 97 % (09/28 0717) Weight:  [89.9 kg] 89.9 kg (09/28 0003)   Constitutional: Appears well-developed and well-nourished.  Eyes: No scleral injection HENT: No OP obstrucion Head: Normocephalic.  Cardiovascular: Normal rate and regular rhythm.  Respiratory: Effort normal, non-labored breathing GI: Soft.  No distension. There is no tenderness.  Skin: WDI  Neuro: Mental Status: Patient is awake, alert, oriented to person, place, month, year, and situation.  Speech shows no aphasia, dysarthria.  Naming, repeating and comprehension are intact.  Patient is able to follow commands without difficulty. Cranial Nerves: II: Visual Fields are full.  III,IV, VI: EOMI without ptosis or diploplia. Pupils equal, round and reactive to light V: Facial sensation is symmetric to temperature VII: Facial movement is symmetric.  VIII: hearing is intact to voice X: Palat elevates symmetrically XI: Shoulder shrug is symmetric. XII: tongue is midline without atrophy or fasciculations.  Motor: Increased  tone UE and spacticity in LE.  Sensory: Sensation is symmetric to light touch and temperature UE and decreased in LE bilaterally DSS Deep Tendon Reflexes: 2+ and symmetric in the biceps and patellae. Sustained sclonus in right ankle.  Plantars: mute Cerebellar: FNF intact  Labs I have reviewed labs in epic and the results pertinent to this consultation are:  CBC    Component Value Date/Time   WBC 5.3 03/05/2020 0426   RBC 3.98 (L) 03/05/2020 0426   HGB 11.0 (L) 03/05/2020 0426   HCT 34.3 (L) 03/05/2020 0426   PLT 364 03/05/2020 0426   MCV 86.2 03/05/2020 0426   MCH 27.6 03/05/2020 0426   MCHC 32.1 03/05/2020 0426   RDW 16.8 (H) 03/05/2020 0426   LYMPHSABS 1.1 02/27/2020 1200   MONOABS 0.9 02/27/2020 1200   EOSABS 0.0 02/27/2020 1200   BASOSABS 0.1 02/27/2020 1200    CMP     Component Value Date/Time   NA 135 03/05/2020 0426   K 3.7 03/05/2020 0426   CL 103 03/05/2020 0426   CO2 24 03/05/2020 0426   GLUCOSE 101 (H) 03/05/2020 0426   BUN 6 03/05/2020 0426   CREATININE 0.80 03/05/2020 0426   CALCIUM 8.1 (L) 03/05/2020 0426   PROT  4.9 (L) 03/05/2020 0426   ALBUMIN 2.3 (L) 03/05/2020 0426   AST 51 (H) 03/05/2020 0426   ALT 90 (H) 03/05/2020 0426   ALKPHOS 34 (L) 03/05/2020 0426   BILITOT 0.3 03/05/2020 0426   GFRNONAA >60 03/05/2020 0426   GFRAA >60 03/05/2020 0426    Lipid Panel  No results found for: CHOL, TRIG, HDL, CHOLHDL, VLDL, LDLCALC, LDLDIRECT   Imaging I have reviewed the images obtained:  MRI examination of the lumbar spine--Extensive muscle edema and enhancement in the posterior paraspinous muscles. Findings most compatible with myositis. No abscess.  Felicie Morn PA-C [facility] [phone]  M-F  (9:00 am- 5:00 PM)  03/05/2020, 1:26 PM   Assessment:  44 yo male with "new onset" of spasticity due to late sequela of spinal cord injury. Now causing significant distress to patient.   Impression: --spasticity due to late sequela  of spinal cord injury  Recommendations: --cyproheptadine 2 mg every 8 hours and may increase to 4 mg every 8 hours if shows improvment   Patient seen and examined with Adaline Sill PA and discussed plan.   44 year old man with spinal cord trauma at age 63 resulting in paraplegia evaluated for tremors. The patient describes events of body temperature fluctuation/flushing, rash on his face associated with acute increases in blood pressure. He has identified clear triggers especially being turned in bed.  Exam showed diffuse spasticity right > left and low amplitude high frequency shivering.  Previous MRI 10-27-1998 showed: Diffuse increase signal at C4 extending down to C6-7.  Patients with spinal cord trauma above T6 are at significant risk for autonomic dysreflexia which manifests as episodes which may include: Pounding headache, sweating above the level of injury, blotchy skin above the level of injury, goose bumps, chills without fever, blurry vision, stuffy nose, anxiety/jitteriness, chest tightness, trouble breathing and importantly rapid increase in blood pressure during episode which may exceed 20-66mmHg of the baseline which may be accompanied by bradycardia. These episodes most commonly result from bladder/bowel distention - any sensation that would normally be painful or uncomfortable.  There is no accepted therapy and management is primarily by avoiding triggers in his case turning in bed which may be exacerbating his lumbar pain and monitoring blood pressure. Discussed a trial of cyproheptadine with the patient which may help autonomic discomfort and has been used extensively for control of spasms and spasticity in spinal cord injury. If he finds relief, may increase incrementally for effect. In the event that his blood pressure is significantly elevated >155mmHg sit patient upright and consider rapid acting short duration nitrate such as nitropaste or nifedipine or hydralazine.  Marisue Humble, MD Page: [phone]

## 2020-03-05 NOTE — Plan of Care (Signed)
  Problem: Activity: Goal: Risk for activity intolerance will decrease Outcome: Progressing   Problem: Coping: Goal: Level of anxiety will decrease Outcome: Progressing   Problem: Pain Managment: Goal: General experience of comfort will improve Outcome: Progressing   

## 2020-03-05 NOTE — TOC Transition Note (Addendum)
Transition of Care [facility]) - CM/SW Discharge Note   Patient Details  Name: RONTAE INGLETT MRN: [id number] Date of Birth: 29-Mar-1976  [facility] CM/SW Contact:  Leone Haven, RN Phone Number: 03/05/2020, 10:08 AM   Clinical Narrative:    NCM spoke with patient and mom at bedside, he states he would like to have valium for his tremors, he states that seems to help and would like one now.  NCM informed Engineer, drilling .  Staff RN will also ask MD about patient having valium at home to take. He is for dc today, he lives with parents and has Personal  Care Services so he does not need HH Services.  He also has a w/chair and w/chair Zenaida Niece that he will be using for transport home today.  NCM was told by Staff RN patient will need ambulance transport, [facility] arranged for transport.  11:57- Discharged is canceled per Irving Burton Staff RN, NCM canceled [facility] transport.  Final next level of care: Home/Self Care Barriers to Discharge: No Barriers Identified   Patient Goals and CMS Choice Patient states their goals for this hospitalization and ongoing recovery are:: meds for tremors   Choice offered to / list presented to : NA  Discharge Placement                       Discharge Plan and Services                  DME Agency: NA       HH Arranged: NA          Social Determinants of Health (SDOH) Interventions     Readmission Risk Interventions No flowsheet data found.

## 2020-03-05 NOTE — Care Management Important Message (Signed)
Important Message  Patient Details  Name: Christopher Valencia MRN: [id number] Date of Birth: 03/24/1976   Medicare Important Message Given:  Yes     Dorena Bodo 03/05/2020, 9:21 AM

## 2020-03-05 NOTE — Telephone Encounter (Signed)
Darcell mom called to let you know Christopher Valencia was going to be released from the hospital today. She did not feel he should be released. The hospital is telling him to follow up with his spine doctor. I let her know you were off this week. She is considering taking him to [facility] if they release him. He has an appointment with you on 03/15/2020. This is just to let you know what is going on.

## 2020-03-05 NOTE — Progress Notes (Signed)
D/C instructions given and reviewed. Questions answered and encouraged to call with any further concerns. IV and tele removed, tolerated well. 

## 2020-03-06 DIAGNOSIS — G8250 Quadriplegia, unspecified: Secondary | ICD-10-CM

## 2020-03-06 DIAGNOSIS — R6510 Systemic inflammatory response syndrome (SIRS) of non-infectious origin without acute organ dysfunction: Secondary | ICD-10-CM

## 2020-03-06 LAB — BASIC METABOLIC PANEL
Anion gap: 10 (ref 5–15)
BUN: 7 mg/dL (ref 6–20)
CO2: 24 mmol/L (ref 22–32)
Calcium: 8.7 mg/dL — ABNORMAL LOW (ref 8.9–10.3)
Chloride: 100 mmol/L (ref 98–111)
Creatinine, Ser: 0.91 mg/dL (ref 0.61–1.24)
GFR calc Af Amer: >60 mL/min (ref >60)
GFR calc non Af Amer: >60 mL/min (ref >60)
Glucose, Bld: 89 mg/dL (ref 70–99)
Potassium: 4.2 mmol/L (ref 3.5–5.1)
Sodium: 134 mmol/L — ABNORMAL LOW (ref 135–145)

## 2020-03-06 LAB — CBC
HCT: 40.2 % (ref 39.0–52.0)
Hemoglobin: 12.9 g/dL — ABNORMAL LOW (ref 13.0–17.0)
MCH: 27.7 pg (ref 26.0–34.0)
MCHC: 32.1 g/dL (ref 30.0–36.0)
MCV: 86.3 fL (ref 80.0–100.0)
Platelets: 449 10*3/uL — ABNORMAL HIGH (ref 150–400)
RBC: 4.66 MIL/uL (ref 4.22–5.81)
RDW: 17.2 % — ABNORMAL HIGH (ref 11.5–15.5)
WBC: 5.5 10*3/uL (ref 4.0–10.5)
nRBC: 0 % (ref 0.0–0.2)

## 2020-03-06 LAB — MAGNESIUM: Magnesium: 2.1 mg/dL (ref 1.7–2.4)

## 2020-03-06 MED ORDER — CYPROHEPTADINE HCL 4 MG PO TABS
2.0000 mg | ORAL_TABLET | Freq: Three times a day (TID) | ORAL | 0 refills | Status: DC | PRN
Start: 2020-03-06 — End: 2020-08-26

## 2020-03-06 NOTE — Progress Notes (Signed)
   03/06/20 0015  Assess: MEWS Score  Temp 99.3 F (37.4 C)  BP 104/66  ECG Heart Rate (!) 103  Resp 20  Level of Consciousness Alert  SpO2 94 %  O2 Device Room Air  Assess: MEWS Score  MEWS Temp 0  MEWS Systolic 0  MEWS Pulse 1  MEWS RR 0  MEWS LOC 0  MEWS Score 1  MEWS Score Color Green  Assess: if the MEWS score is Yellow or Red  Were vital signs taken at a resting state? Yes  Focused Assessment No change from prior assessment  Early Detection of Sepsis Score *See Row Information* Low  MEWS guidelines implemented *See Row Information* No, vital signs rechecked  Treat  MEWS Interventions Escalated (See documentation below)  Pain Scale 0-10  Pain Score 0  Notify: Charge Nurse/RN  Name of Charge Nurse/RN Notified Jequetta, RN  Date Charge Nurse/RN Notified 03/06/20  Time Charge Nurse/RN Notified 0030

## 2020-03-06 NOTE — Discharge Summary (Signed)
Physician Discharge Summary  [name] CLE:[id number] DOB: 1976/03/25 DOA: 02/27/2020  PCP: Christopher Panda, MD  Admit date: 02/27/2020 Discharge date: 03/06/2020  Admitted From: home Disposition:  home   Recommendations for Outpatient Follow-up:  1. F/u on Lower extremity spasms and lumbar myositis  Home Health:  none Discharge Condition:  stable   CODE STATUS:  Full code   Diet recommendation:  Heart healthy Consultations:  ID  Neurology  Procedures/Studies: . none   Discharge Diagnoses:  Principal Problem:   Colitis Active Problems:   SIRS (systemic inflammatory response syndrome) (HCC)   Chronic spastic tetraplegia (HCC)   Neurogenic bowel   Neurogenic bladder   Paraplegia following spinal cord injury ([place])   Hyponatremia   Hypokalemia    Lumbar myositis   Rhabdomyolysis   Elevated liver enzymes   Brief Summary: Christopher Valencia is a 44 y.o. paraplegic male with history of spinal cord injury, C5-C7 along with paraplegia, neurogenic bowel and bladder who is wheelchair bound who presented with 10 days with intermittent fever highest up to 1002.3 F..  There was some concern for pulled muscle in the back so he went to [facility] med center where he was prescribed a muscle relaxant.  He went to ED on 9/11 with fever and shaking of his body. Work-up including UA, chest x-ray came back negative for acute findings therefore he discharged home in stable conditionand was prescribed Keflex. He lives with his parents at home.  He again presented to hospital since he was not getting better. Upon arrival to ED: Patient had fever of 100.1, tachycardic, lactic acid: 4.1, blood pressure noted to be low 80s/50s.  Normally runs on the lower side at home.CRP: 1.3, ESR: 37, COVID-19 negative, UA positive for rare bacteria. Platelet: 443, AST: 113, ALT: 57, sodium: 127, chest x-ray negative for acute findings. CT abdomen/pelvis and CT chest was obtained : Mild thickening of the mid to  distal sigmoid colon which may represent sequelae associated with mild colitis.  Patient received IV fluid boluses, Vanc, cefepime and Flagyl in ED.  Patient was then admitted to hospital for further evaluation and treatment.  Hospital Course:   SIRS with lactic acidosis - not fully convinced that the mild colitis seen on CT was the source- blood cultures negative - ? If viral infection - symptoms have resolved- temp no higher than 99 degrees, lactic acid improved, HR 70s to low 100s, BP 107/77  Colitis? - evaluated by ID. Initially treated with Ceftriaxone and Flagyl and later with Levaquin and Flagyl- completed course yesterday.  Lower back pain, myositis with rhabdomyolysis - he received Flexeril for it recently - 9/23- underwent MRI of lumbar spine> Extensive muscle edema and enhancement in the posterior paraspinous muscles. Findings most compatible with myositis.  - CK 2,573 improved to 904 when last checked on 9/28  Elevated LFTs- improving- possibly related to rhabdomyolysis vs underlying viral infection, Hepatitis panel negative Hepatic Function Latest Ref Rng & Units 03/05/2020 03/04/2020 03/02/2020  Total Protein 6.5 - 8.1 g/dL 4.9(L) 5.0(L) 5.2(L)  Albumin 3.5 - 5.0 g/dL 2.3(L) 2.4(L) 2.5(L)  AST 15 - 41 U/L 51(H) 90(H) 107(H)  ALT 0 - 44 U/L 90(H) 118(H) 117(H)  Alk Phosphatase 38 - 126 U/L 34(L) 34(L) 36(L)  Total Bilirubin 0.3 - 1.2 mg/dL 0.3 0.6 0.3   Lower extremity spasms - likely autonomic related to his spinal cord injury - Neurology consulted. Prescribed Cyproheptadine yesterday. Per patient, this has helped him a great deal. Will prescribe.  Hypokalemia and Hyponatremia - replaced  Paraplegia, neurogenic bowel and bladder - cared for at home by his family   Discharge Exam: Vitals:   03/06/20 0817 03/06/20 1213  BP: 123/80 107/77  Pulse: (!) 107 94  Resp: 15 12  Temp: 98.2 F (36.8 C) 97.7 F (36.5 C)  SpO2: 96% 97%   Vitals:   03/06/20 0615  03/06/20 0630 03/06/20 0817 03/06/20 1213  BP:   123/80 107/77  Pulse: 86 86 (!) 107 94  Resp: _0 Temp:   98.2 F (36.8 C) 97.7 F (36.5 C)  TempSrc:   Oral Oral  SpO2:   96% 97%  Weight:      Height:        General: Pt is alert, awake, not in acute distress Cardiovascular: RRR, S1/S2 +, no rubs, no gallops Respiratory: CTA bilaterally, no wheezing, no rhonchi Abdominal: Soft, NT, ND, bowel sounds + Extremities: no edema, no cyanosis   Discharge Instructions  Discharge Instructions    Call MD for:  severe uncontrolled pain   Complete by: As directed    Diet - low sodium heart healthy   Complete by: As directed    Diet general   Complete by: As directed    Discharge instructions   Complete by: As directed    Follow up with your primary care provider in 1 week. Follow up with your spine doctor in 1 week to discuss about tremors.   Increase activity slowly   Complete by: As directed    Increase activity slowly   Complete by: As directed      Allergies as of 03/06/2020   No Known Allergies     Medication List    STOP taking these medications   cephALEXin 500 MG capsule Commonly known as: KEFLEX   cyclobenzaprine 10 MG tablet Commonly known as: FLEXERIL     TAKE these medications   acetaminophen 500 MG tablet Commonly known as: TYLENOL Take 500 mg by mouth every 6 (six) hours as needed for mild pain.   baclofen 10 MG tablet Commonly known as: LIORESAL Take 1 tablet (10 mg total) by mouth 3 (three) times daily.   cyproheptadine 4 MG tablet Commonly known as: PERIACTIN Take 0.5 tablets (2 mg total) by mouth 3 (three) times daily as needed (spasms).   diazepam 5 MG tablet Commonly known as: VALIUM Take 1 tablet (5 mg total) by mouth every 12 (twelve) hours as needed for muscle spasms.   gabapentin 100 MG capsule Commonly known as: Neurontin Take 1 capsule (100 mg total) by mouth 3 (three) times daily.   meloxicam 15 MG tablet Commonly known  as: MOBIC Take 1 tablet (15 mg total) by mouth daily.   pantoprazole 40 MG tablet Commonly known as: PROTONIX Take 1 tablet (40 mg total) by mouth daily.   traMADol 50 MG tablet Commonly known as: ULTRAM Take 1 tablet (50 mg total) by mouth every 6 (six) hours as needed. What changed: reasons to take this       Follow-up Information    Christopher Panda, MD. Go on 03/11/2020.   Specialty: Internal Medicine Why: regular follow up_1 :30pm Contact information: [address] [place] [address] [phone]        Go to spine doctor.   Why: on your appointment for tremors,             No Known Allergies    CT Chest W Contrast  Result Date: 02/27/2020 CLINICAL DATA:  Status post trauma. EXAM: CT CHEST, ABDOMEN, AND PELVIS WITH CONTRAST TECHNIQUE: Multidetector CT imaging of the chest, abdomen and pelvis was performed following the standard protocol during bolus administration of intravenous contrast. CONTRAST:  130m OMNIPAQUE IOHEXOL 300 MG/ML  SOLN COMPARISON:  None. FINDINGS: CT CHEST FINDINGS Cardiovascular: No significant vascular findings. Normal heart size. No pericardial effusion. Mediastinum/Nodes: No enlarged mediastinal, hilar, or axillary lymph nodes. Thyroid gland, trachea, and esophagus demonstrate no significant findings. Lungs/Pleura: Very mild scarring and/or atelectasis is seen within the posteromedial aspect of the left lung base. There is no evidence of a pleural effusion or pneumothorax. Musculoskeletal: No chest wall mass or suspicious bone lesions identified. CT ABDOMEN PELVIS FINDINGS Hepatobiliary: No focal liver abnormality is seen. No gallstones, gallbladder wall thickening, or biliary dilatation. Pancreas: Unremarkable. No pancreatic ductal dilatation or surrounding inflammatory changes. Spleen: Normal in size without focal abnormality. Adrenals/Urinary Tract: Adrenal glands are unremarkable. Kidneys are normal, without renal calculi, focal lesion, or  hydronephrosis. Bladder is unremarkable. Stomach/Bowel: Stomach is within normal limits. Appendix appears normal. No evidence of bowel dilatation. Mild thickening of the mid to distal sigmoid colon is seen. Vascular/Lymphatic: No significant vascular findings are present. No enlarged abdominal or pelvic lymph nodes. Reproductive: The prostate gland is mildly enlarged. Other: No abdominal wall hernia or abnormality. No abdominopelvic ascites. Musculoskeletal: No acute or significant osseous findings. IMPRESSION: 1. No evidence of acute or active cardiopulmonary disease. 2. Mild thickening of the mid to distal sigmoid colon which may represent sequelae associated with mild colitis. Electronically Signed   By: TVirgina NorfolkM.D.   On: 02/27/2020 17:30   MR Lumbar Spine W Wo Contrast  Result Date: 02/29/2020 CLINICAL DATA:  Rule out epidural abscess.  Paraplegia. EXAM: MRI LUMBAR SPINE WITHOUT AND WITH CONTRAST TECHNIQUE: Multiplanar and multiecho pulse sequences of the lumbar spine were obtained without and with intravenous contrast. CONTRAST:  121mGADAVIST GADOBUTROL 1 MMOL/ML IV SOLN COMPARISON:  MRI lumbar spine 11/05/2006 FINDINGS: Segmentation:  Normal Alignment:  Normal Vertebrae:  Negative for fracture or mass.  No evidence of discitis. Conus medullaris and cauda equina: Conus extends to the L1-2 level. Conus and cauda equina appear normal. Paraspinal and other soft tissues: Prominent muscle edema is present in the posterior paraspinous muscles. The muscles show diffuse enhancement. Psoas muscle normal. No fluid collection identified. Disc levels: L1-2: Negative L2-3: Negative L3-4: Negative L4-5: Mild disc bulging and mild facet degeneration. Negative for disc protrusion or stenosis L5-S1: Mild disc degeneration with disc bulging and endplate spurring. Mild subarticular stenosis bilaterally. IMPRESSION: Extensive muscle edema and enhancement in the posterior paraspinous muscles. Findings most  compatible with myositis. No abscess. No evidence of discitis or epidural abscess. Electronically Signed   By: ChFranchot Gallo.D.   On: 02/29/2020 17:05   CT Abdomen Pelvis W Contrast  Result Date: 02/27/2020 CLINICAL DATA:  Abdominal pain and fever. EXAM: CT CHEST, ABDOMEN, AND PELVIS WITH CONTRAST TECHNIQUE: Multidetector CT imaging of the chest, abdomen and pelvis was performed following the standard protocol during bolus administration of intravenous contrast. CONTRAST:  10041mMNIPAQUE IOHEXOL 300 MG/ML  SOLN COMPARISON:  None. FINDINGS: CT CHEST FINDINGS Cardiovascular: No significant vascular findings. Normal heart size. No pericardial effusion. Mediastinum/Nodes: No enlarged mediastinal, hilar, or axillary lymph nodes. Thyroid gland, trachea, and esophagus demonstrate no significant findings. Lungs/Pleura: Very mild scarring and/or atelectasis is seen within the posteromedial aspect of the left lung base. There is no evidence of a pleural effusion or pneumothorax. Musculoskeletal: No chest wall  mass or suspicious bone lesions identified. CT ABDOMEN PELVIS FINDINGS Hepatobiliary: No focal liver abnormality is seen. No gallstones, gallbladder wall thickening, or biliary dilatation. Pancreas: Unremarkable. No pancreatic ductal dilatation or surrounding inflammatory changes. Spleen: Normal in size without focal abnormality. Adrenals/Urinary Tract: Adrenal glands are unremarkable. Kidneys are normal, without renal calculi, focal lesion, or hydronephrosis. Bladder is unremarkable. Stomach/Bowel: Stomach is within normal limits. Appendix appears normal. No evidence of bowel dilatation. Mild thickening of the mid to distal sigmoid colon is seen. Vascular/Lymphatic: No significant vascular findings are present. No enlarged abdominal or pelvic lymph nodes. Reproductive: The prostate gland is mildly enlarged. Other: No abdominal wall hernia or abnormality. No abdominopelvic ascites. Musculoskeletal: No acute or  significant osseous findings. IMPRESSION: 1. No evidence of acute or active cardiopulmonary disease. 2. Mild thickening of the mid to distal sigmoid colon which may represent sequelae associated with mild colitis. Electronically Signed   By: Virgina Norfolk M.D.   On: 02/27/2020 16:44   DG Chest Port 1 View  Result Date: 02/27/2020 CLINICAL DATA:  fever and tremors. Pt has hx of cervical injury and is paraplegic. Back pain,weak EXAM: PORTABLE CHEST - 1 VIEW COMPARISON:  02/17/2020 and previous FINDINGS: Lungs are clear. Heart size and mediastinal contours are within normal limits. No effusion.  No pneumothorax. Cervical fixation hardware partially visualized. IMPRESSION: No acute cardiopulmonary disease. Electronically Signed   By: Lucrezia Europe M.D.   On: 02/27/2020 12:02   DG Chest Port 1 View  Result Date: 02/17/2020 CLINICAL DATA:  Fever EXAM: PORTABLE CHEST 1 VIEW COMPARISON:  November 08, 2012 FINDINGS: The lungs are clear. The heart size and pulmonary vascularity are normal. No adenopathy. There is postoperative change in the visualized cervical spine. IMPRESSION: Lungs clear.  Cardiac silhouette normal. Electronically Signed   By: Lowella Grip III M.D.   On: 02/17/2020 13:33   VAS [place] LOWER EXTREMITY VENOUS (DVT)  Result Date: 03/04/2020  Lower Venous DVTStudy Indications: Fever, and Edema.  Limitations: Paraplegia, rigors, edema. Comparison Study: No prior study on file Performing Technologist: Sharion Dove RVS  Examination Guidelines: A complete evaluation includes B-mode imaging, spectral Doppler, color Doppler, and power Doppler as needed of all accessible portions of each vessel. Bilateral testing is considered an integral part of a complete examination. Limited examinations for reoccurring indications may be performed as noted. The reflux portion of the exam is performed with the patient in reverse Trendelenburg.  +---------+---------------+---------+-----------+----------+--------------+  RIGHT    CompressibilityPhasicitySpontaneityPropertiesThrombus Aging +---------+---------------+---------+-----------+----------+--------------+ CFV      Full           Yes      Yes                                 +---------+---------------+---------+-----------+----------+--------------+ SFJ      Full                                                        +---------+---------------+---------+-----------+----------+--------------+ FV Prox  Full                                                        +---------+---------------+---------+-----------+----------+--------------+  FV Mid   Full                                                        +---------+---------------+---------+-----------+----------+--------------+ FV DistalFull                                                        +---------+---------------+---------+-----------+----------+--------------+ PFV      Full                                                        +---------+---------------+---------+-----------+----------+--------------+ POP      Full                                                        +---------+---------------+---------+-----------+----------+--------------+ PTV                                                   Not visualized +---------+---------------+---------+-----------+----------+--------------+ PERO                                                  Not visualized +---------+---------------+---------+-----------+----------+--------------+   +---------+---------------+---------+-----------+----------+-------------------+ LEFT     CompressibilityPhasicitySpontaneityPropertiesThrombus Aging      +---------+---------------+---------+-----------+----------+-------------------+ CFV      Full           Yes      Yes                                      +---------+---------------+---------+-----------+----------+-------------------+ SFJ      Full                                                              +---------+---------------+---------+-----------+----------+-------------------+ FV Prox  Full                                                             +---------+---------------+---------+-----------+----------+-------------------+ FV Mid   Full                                                             +---------+---------------+---------+-----------+----------+-------------------+  FV DistalFull                                                             +---------+---------------+---------+-----------+----------+-------------------+ PFV      Full                                                             +---------+---------------+---------+-----------+----------+-------------------+ POP                                                   patent by color and                                                       Doppler             +---------+---------------+---------+-----------+----------+-------------------+ PTV                                                   Not visualized      +---------+---------------+---------+-----------+----------+-------------------+ PERO                                                  Not visualized      +---------+---------------+---------+-----------+----------+-------------------+     Summary: RIGHT: - There is no evidence of deep vein thrombosis in the lower extremity. However, portions of this examination were limited- see technologist comments above.  LEFT: - There is no evidence of deep vein thrombosis in the lower extremity. However, portions of this examination were limited- see technologist comments above.  *See table(s) above for measurements and observations. Electronically signed by Deitra Mayo MD on 03/04/2020 at 7:03:03 AM.    Final      The results of significant diagnostics from this hospitalization (including imaging, microbiology,  ancillary and laboratory) are listed below for reference.     Microbiology: Recent Results (from the past 240 hour(s))  Culture, blood (routine x 2)     Status: None   Collection Time: 02/27/20 11:36 AM   Specimen: BLOOD  Result Value Ref Range Status   Specimen Description BLOOD BLOOD LEFT HAND  Final   Special Requests   Final    BOTTLES DRAWN AEROBIC AND ANAEROBIC Blood Culture adequate volume   Culture   Final    NO GROWTH 5 DAYS Performed at [facility], [address], [place], [place] [address]    Report Status 03/03/2020 FINAL  Final  Culture, blood (routine x 2)     Status: None   Collection Time: 02/27/20 12:29 PM  Specimen: BLOOD  Result Value Ref Range Status   Specimen Description BLOOD BLOOD RIGHT HAND  Final   Special Requests   Final    BOTTLES DRAWN AEROBIC AND ANAEROBIC Blood Culture results may not be optimal due to an excessive volume of blood received in culture bottles   Culture   Final    NO GROWTH 5 DAYS Performed at [facility], [place] [address] [place], [place] [address]    Report Status 03/03/2020 FINAL  Final  SARS Coronavirus 2 by RT PCR (hospital order, performed in [facility] hospital lab) Nasopharyngeal Nasopharyngeal Swab     Status: None   Collection Time: 02/27/20 12:52 PM   Specimen: Nasopharyngeal Swab  Result Value Ref Range Status   SARS Coronavirus 2 NEGATIVE NEGATIVE Final    Comment: (NOTE) SARS-CoV-2 target nucleic acids are NOT DETECTED.  The SARS-CoV-2 RNA is generally detectable in upper and lower respiratory specimens during the acute phase of infection. The lowest concentration of SARS-CoV-2 viral copies this assay can detect is 250 copies / mL. A negative result does not preclude SARS-CoV-2 infection and should not be used as the sole basis for treatment or other patient management decisions.  A negative result may occur with improper specimen collection / handling, submission of specimen other than  nasopharyngeal swab, presence of viral mutation(s) within the areas targeted by this assay, and inadequate number of viral copies (<250 copies / mL). A negative result must be combined with clinical observations, patient history, and epidemiological information.  Fact Sheet for Patients:   StrictlyIdeas.no  Fact Sheet for Healthcare Providers: BankingDealers.co.za  This test is not yet approved or  cleared by the [place] FDA and has been authorized for detection and/or diagnosis of SARS-CoV-2 by FDA under an Emergency Use Authorization (EUA).  This EUA will remain in effect (meaning this test can be used) for the duration of the COVID-19 declaration under Section 564(b)(1) of the Act, 21 U.S.C. section 360bbb-3(b)(1), unless the authorization is terminated or revoked sooner.  Performed at [facility], [place] [address], [place], [place] [address]   Urine culture     Status: None   Collection Time: 02/27/20  1:25 PM   Specimen: Urine, Random  Result Value Ref Range Status   Specimen Description URINE, RANDOM  Final   Special Requests NONE  Final   Culture   Final    NO GROWTH Performed at [facility], [place] [address], [place], [place] [address]    Report Status 02/28/2020 FINAL  Final     Labs: BNP (last 3 results) No results for input(s): BNP in the last 8760 hours. Basic Metabolic Panel: Recent Labs  Lab 03/01/20 1057 03/01/20 1057 03/02/20 0347 03/03/20 0654 03/04/20 0500 03/05/20 0426 03/06/20 0913  NA 136   < > 136 134* 133* 135 134*  K 3.8   < > 3.4* 4.1 3.9 3.7 4.2  CL 104   < > 102 102 99 103 100  CO2 22   < > _0 GLUCOSE 116*   < > 102* 85 92 101* 89  BUN 6   < > 5* 5* _1 CREATININE 0.74   < > 0.61 0.82 0.89 0.80 0.91  CALCIUM 7.7*   < > 7.9* 8.3* 8.2* 8.1* 8.7*  MG 2.0  --  2.0  --  2.0  --  2.1  PHOS 3.1  --   --   --   --   --   --    < > =  values in this interval not  displayed.   Liver Function Tests: Recent Labs  Lab 03/01/20 1057 03/02/20 0347 03/04/20 0500 03/05/20 0426  AST 145* 107* 90* 51*  ALT 116* 117* 118* 90*  ALKPHOS 36* 36* 34* 34*  BILITOT 0.6 0.3 0.6 0.3  PROT 5.5* 5.2* 5.0* 4.9*  ALBUMIN 2.7* 2.5* 2.4* 2.3*   No results for input(s): LIPASE, AMYLASE in the last 168 hours. No results for input(s): AMMONIA in the last 168 hours. CBC: Recent Labs  Lab 03/01/20 1057 03/02/20 0347 03/04/20 0500 03/05/20 0426 03/06/20 0913  WBC 6.5 5.4 7.0 5.3 5.5  HGB 12.0* 11.3* 11.5* 11.0* 12.9*  HCT 37.7* 35.6* 35.4* 34.3* 40.2  MCV 85.1 84.6 85.1 86.2 86.3  PLT 417* 421* 409* 364 449*   Cardiac Enzymes: Recent Labs  Lab 02/29/20 1522 03/01/20 1057 03/02/20 0347 03/05/20 0426  CKTOTAL 2,573* 2,446* 1,893* 904*   BNP: Invalid input(s): POCBNP CBG: No results for input(s): GLUCAP in the last 168 hours. D-Dimer No results for input(s): DDIMER in the last 72 hours. Hgb A1c No results for input(s): HGBA1C in the last 72 hours. Lipid Profile No results for input(s): CHOL, HDL, LDLCALC, TRIG, CHOLHDL, LDLDIRECT in the last 72 hours. Thyroid function studies No results for input(s): TSH, T4TOTAL, T3FREE, THYROIDAB in the last 72 hours.  Invalid input(s): FREET3 Anemia work up No results for input(s): VITAMINB12, FOLATE, FERRITIN, TIBC, IRON, RETICCTPCT in the last 72 hours. Urinalysis    Component Value Date/Time   COLORURINE YELLOW 02/27/2020 1257   APPEARANCEUR HAZY (A) 02/27/2020 1257   LABSPEC 1.008 02/27/2020 1257   PHURINE 6.0 02/27/2020 1257   GLUCOSEU NEGATIVE 02/27/2020 1257   HGBUR MODERATE (A) 02/27/2020 1257   BILIRUBINUR NEGATIVE 02/27/2020 1257   KETONESUR NEGATIVE 02/27/2020 1257   PROTEINUR NEGATIVE 02/27/2020 1257   UROBILINOGEN 0.2 03/13/2013 1448   NITRITE NEGATIVE 02/27/2020 1257   LEUKOCYTESUR NEGATIVE 02/27/2020 1257   Sepsis Labs Invalid input(s): PROCALCITONIN,  WBC,   LACTICIDVEN Microbiology Recent Results (from the past 240 hour(s))  Culture, blood (routine x 2)     Status: None   Collection Time: 02/27/20 11:36 AM   Specimen: BLOOD  Result Value Ref Range Status   Specimen Description BLOOD BLOOD LEFT HAND  Final   Special Requests   Final    BOTTLES DRAWN AEROBIC AND ANAEROBIC Blood Culture adequate volume   Culture   Final    NO GROWTH 5 DAYS Performed at [facility], [place] [address] [place], [place] [address]    Report Status 03/03/2020 FINAL  Final  Culture, blood (routine x 2)     Status: None   Collection Time: 02/27/20 12:29 PM   Specimen: BLOOD  Result Value Ref Range Status   Specimen Description BLOOD BLOOD RIGHT HAND  Final   Special Requests   Final    BOTTLES DRAWN AEROBIC AND ANAEROBIC Blood Culture results may not be optimal due to an excessive volume of blood received in culture bottles   Culture   Final    NO GROWTH 5 DAYS Performed at [facility], [place] [address], [place], [place] [address]    Report Status 03/03/2020 FINAL  Final  SARS Coronavirus 2 by RT PCR (hospital order, performed in [facility] hospital lab) Nasopharyngeal Nasopharyngeal Swab     Status: None   Collection Time: 02/27/20 12:52 PM   Specimen: Nasopharyngeal Swab  Result Value Ref Range Status   SARS Coronavirus 2 NEGATIVE NEGATIVE  Final    Comment: (NOTE) SARS-CoV-2 target nucleic acids are NOT DETECTED.  The SARS-CoV-2 RNA is generally detectable in upper and lower respiratory specimens during the acute phase of infection. The lowest concentration of SARS-CoV-2 viral copies this assay can detect is 250 copies / mL. A negative result does not preclude SARS-CoV-2 infection and should not be used as the sole basis for treatment or other patient management decisions.  A negative result may occur with improper specimen collection / handling, submission of specimen other than nasopharyngeal swab, presence of viral mutation(s) within  the areas targeted by this assay, and inadequate number of viral copies (<250 copies / mL). A negative result must be combined with clinical observations, patient history, and epidemiological information.  Fact Sheet for Patients:   StrictlyIdeas.no  Fact Sheet for Healthcare Providers: BankingDealers.co.za  This test is not yet approved or  cleared by the [place] FDA and has been authorized for detection and/or diagnosis of SARS-CoV-2 by FDA under an Emergency Use Authorization (EUA).  This EUA will remain in effect (meaning this test can be used) for the duration of the COVID-19 declaration under Section 564(b)(1) of the Act, 21 U.S.C. section 360bbb-3(b)(1), unless the authorization is terminated or revoked sooner.  Performed at [facility], [place] [address], [place], [place] [address]   Urine culture     Status: None   Collection Time: 02/27/20  1:25 PM   Specimen: Urine, Random  Result Value Ref Range Status   Specimen Description URINE, RANDOM  Final   Special Requests NONE  Final   Culture   Final    NO GROWTH Performed at [facility], [place] [address], [place], [place] [address]    Report Status 02/28/2020 FINAL  Final     Time coordinating discharge in minutes: 65  SIGNED:   Debbe Odea, MD  [facility] 03/06/2020, 12:14 PM

## 2020-03-06 NOTE — Progress Notes (Signed)
D/C instructions given and reviewed. Questions answered and encouraged to call with any f/u questions. Tele removed. Awaiting PTAR for transport.

## 2020-03-06 NOTE — TOC Transition Note (Signed)
Transition of Care [facility]) - CM/SW Discharge Note   Patient Details  Name: Christopher Valencia MRN: [id number] Date of Birth: 27-Mar-1976  [facility]) CM/SW Contact:  Leone Haven, RN Phone Number: 03/06/2020, 1:14 PM   Clinical Narrative:    Patient is for dc today, he will need ptar transport.  NCM scheduled ptar transport.     Final next level of care: Home/Self Care Barriers to Discharge: No Barriers Identified   Patient Goals and CMS Choice Patient states their goals for this hospitalization and ongoing recovery are:: get better   Choice offered to / list presented to : NA  Discharge Placement                       Discharge Plan and Services                  DME Agency: NA       HH Arranged: NA          Social Determinants of Health (SDOH) Interventions     Readmission Risk Interventions No flowsheet data found.

## 2020-03-14 NOTE — Telephone Encounter (Signed)
Patients mother called again to express ongoing spasms that patient is having following his hospitalization.  She is bringing the patient to his appointment tomorrow but realizes patient probably will not allow her back for the visit.  She feats the patient won't tell the truth about his spasms, swelling, and redness around his knee.  She wants [place] to inform Dr. Berline Chough so she can be on the lookout for these medical issues.  She says all is not well and even though he is a grown adult, she is worried.

## 2020-03-15 ENCOUNTER — Other Ambulatory Visit: Payer: Self-pay

## 2020-03-15 ENCOUNTER — Encounter: Payer: Medicare Other | Admitting: Physical Medicine and Rehabilitation

## 2020-03-15 MED ORDER — CYPROHEPTADINE HCL 4 MG PO TABS
2.0000 mg | ORAL_TABLET | Freq: Three times a day (TID) | ORAL | 5 refills | Status: DC | PRN
Start: 2020-03-15 — End: 2020-08-26

## 2020-03-15 MED ORDER — APIXABAN 5 MG PO TABS: ORAL_TABLET | ORAL | 3 refills | Status: DC

## 2020-03-15 MED ORDER — BACLOFEN 10 MG PO TABS
10.0000 mg | ORAL_TABLET | Freq: Three times a day (TID) | ORAL | 5 refills | Status: DC
Start: 2020-03-15 — End: 2020-08-02

## 2020-03-15 NOTE — Telephone Encounter (Signed)
Was checked for blood clots in the hospital.   When leans back to elevate legs/ has more spasms.     If shakes, gets real warm- and thinks that's the cause of spasms.   Had colitis and was admitted in the hospital.  Also has myositis of back Extensive muscle enhancement of paraspinals   Current meds he's on: Baclofen 10 mg TID Periactin/Cyproheptadine Diazepam prn 6 pills Gabapentin Meolxicam Protonix- Not taking tramadol    1. Stop gabapentin and Meloxicam- both can cause swelling.  2. Starting on Eliquis/Apixiban- 10 mg 2x/day x 7 days, then  5 mg 2x/day- because I think he has a DVT 3. Order Dopplers/Ultrasound of LE's due to LE swelling.  4. Needs to get Protonix from PCP.  5. I've reordered the Cyproheptadine 2-4 mg 3x/day 6. Spasms - are likely causing Autonomic dysreflexia. = causes his BP to go through the roof- due to a painful stimulus.

## 2020-03-20 ENCOUNTER — Encounter: Payer: Self-pay | Admitting: Physical Medicine and Rehabilitation

## 2020-03-20 ENCOUNTER — Encounter
Payer: Medicare Other | Attending: Physical Medicine and Rehabilitation | Admitting: Physical Medicine and Rehabilitation

## 2020-03-20 ENCOUNTER — Other Ambulatory Visit: Payer: Self-pay

## 2020-03-20 DIAGNOSIS — Z993 Dependence on wheelchair: Secondary | ICD-10-CM | POA: Diagnosis not present

## 2020-03-20 DIAGNOSIS — M6008 Infective myositis, other site: Secondary | ICD-10-CM

## 2020-03-20 DIAGNOSIS — G8250 Quadriplegia, unspecified: Secondary | ICD-10-CM | POA: Diagnosis not present

## 2020-03-20 DIAGNOSIS — K529 Noninfective gastroenteritis and colitis, unspecified: Secondary | ICD-10-CM

## 2020-03-20 DIAGNOSIS — R252 Cramp and spasm: Secondary | ICD-10-CM | POA: Diagnosis not present

## 2020-03-20 NOTE — Progress Notes (Signed)
  Due to national recommendations of social distancing because of COVID 51, an audio/video tele-health visit is felt to be the most appropriate encounter for this patient at this time. See MyChart message from today for the patient's consent to a tele-health encounter with [facility]. This is a follow up tele-visit via Webex. The patient is at home. MD is at office.    Patient is a 44 yr old male with C6 Quadriplegia, neurogenic bowel and bladder, and spasticity here for f/u.   Pt reports he has Dopplers scheduled for Friday.  Overall, spasms much better- muscle pull in back is better and overall spasms getting better.   Swelling in L hand and B/L LEs still there, but went down slightly after elevating on 3-4 pillows last night. Hadn't reduced in swelling prior to then.   Stopped Gabapentin and Meloxicam last Friday when we discussed it.   No pressure sores, HOWEVER has a popped blister from RLE swelling on back of R knee/in fold of knee- keeping a close eye on it so doesn't become pressure sore.   Spasticity IS better- still taking Tramadol and Baclofen- but has been concerned cannot mix them- only taking tramadol occ-   "trembles" stop quickly after movement.   Also thinks Blood thinner making him Lightheaded- thought should reduce the dose. Explained will reduce dose after 7 days.   Exam: webex  Plan: Patient is a 44 yr old male with C6 Quadriplegia, neurogenic bowel and bladder, and spasticity here for f/u.  Recent colitis and myositis of back muscle admitted to hospital with worsening spasticity as a result.   1. Baclofen is a new medicine for him- CAN mix baclofen and Tramadol, but could make him sleepier, just FYI- also more constipation.   2. Try to start weaning Cyproheptadine to 2-4 mg 2x/day in 2-3 weeks- then wean by 1 pill/week, til off, if possible- because can make him gain weight/makes people hungry AND sleepy.   3. Will wait on  Results of Doppler and call him if he needs to continue blood thinner.   4. Doesn't need refills right now of meds- did them last week.   5. F/U in 2 months, double appointment-  since was just hospitalized-   I spent a total of 30 minutes on appointment- as detailed above.

## 2020-03-20 NOTE — Patient Instructions (Signed)
Plan: Patient is a 44 yr old male with C6 Quadriplegia, neurogenic bowel and bladder, and spasticity here for f/u.  Recent colitis and myositis of back muscle admitted to hospital with worsening spasticity as a result.   1. Baclofen is a new medicine for him- CAN mix baclofen and Tramadol, but could make him sleepier, just FYI- also more constipation.   2. Try to start weaning Cyproheptadine to 2-4 mg 2x/day in 2-3 weeks- then wean by 1 pill/week, til off, if possible- because can make him gain weight/makes people hungry AND sleepy.   3. Will wait on Results of Doppler and call him if he needs to continue blood thinner.   4. Doesn't need refills right now of meds- did them last week.   5. F/U in 2 months, double appointment-  since was just hospitalized-

## 2020-03-22 ENCOUNTER — Ambulatory Visit (HOSPITAL_COMMUNITY)
Admission: RE | Admit: 2020-03-22 | Discharge: 2020-03-22 | Disposition: A | Discharge status: Home / Self Care | Payer: Medicare Other | Source: Ambulatory Visit | Attending: Physical Medicine and Rehabilitation | Admitting: Physical Medicine and Rehabilitation

## 2020-03-22 ENCOUNTER — Other Ambulatory Visit: Payer: Self-pay

## 2020-03-22 DIAGNOSIS — R600 Localized edema: Secondary | ICD-10-CM | POA: Insufficient documentation

## 2020-03-22 NOTE — Progress Notes (Signed)
Lower extremity venous has been completed.   Preliminary results in CV Proc.   Blanch Media 03/22/2020 2:25 PM

## 2020-05-17 ENCOUNTER — Encounter: Payer: Medicare Other | Admitting: Physical Medicine and Rehabilitation

## 2020-06-26 ENCOUNTER — Telehealth: Payer: Self-pay

## 2020-06-26 NOTE — Telephone Encounter (Signed)
Patient called stating he had Colitis in September and in the past few weeks he has had runny BM. Please advise.

## 2020-06-28 NOTE — Telephone Encounter (Signed)
Left a message on voicemail that metamucil is likely the best to bulk up stools- however if needs something else, to give me a return call.  Thanks, ML

## 2020-07-03 ENCOUNTER — Encounter: Payer: Medicare Other | Admitting: Physical Medicine and Rehabilitation

## 2020-07-03 ENCOUNTER — Other Ambulatory Visit: Payer: Self-pay

## 2020-07-15 ENCOUNTER — Encounter: Payer: Medicare Other | Admitting: Physical Medicine and Rehabilitation

## 2020-08-02 ENCOUNTER — Other Ambulatory Visit: Payer: Self-pay | Admitting: Physical Medicine and Rehabilitation

## 2020-08-02 ENCOUNTER — Encounter: Payer: Medicare Other | Admitting: Physical Medicine and Rehabilitation

## 2020-08-26 ENCOUNTER — Encounter
Payer: Medicare Other | Attending: Physical Medicine and Rehabilitation | Admitting: Physical Medicine and Rehabilitation

## 2020-08-26 ENCOUNTER — Encounter: Payer: Self-pay | Admitting: Physical Medicine and Rehabilitation

## 2020-08-26 ENCOUNTER — Other Ambulatory Visit: Payer: Self-pay

## 2020-08-26 VITALS — BP 152/97 | HR 94 | Temp 97.9°F | Ht 70.0 in | Wt 198.1 lb

## 2020-08-26 DIAGNOSIS — G8250 Quadriplegia, unspecified: Secondary | ICD-10-CM | POA: Insufficient documentation

## 2020-08-26 DIAGNOSIS — S14106S Unspecified injury at C6 level of cervical spinal cord, sequela: Secondary | ICD-10-CM | POA: Diagnosis not present

## 2020-08-26 DIAGNOSIS — R252 Cramp and spasm: Secondary | ICD-10-CM | POA: Diagnosis not present

## 2020-08-26 DIAGNOSIS — Z993 Dependence on wheelchair: Secondary | ICD-10-CM | POA: Diagnosis not present

## 2020-08-26 MED ORDER — BACLOFEN 10 MG PO TABS
10.0000 mg | ORAL_TABLET | Freq: Three times a day (TID) | ORAL | 5 refills | Status: DC
Start: 2020-08-26 — End: 2021-01-22

## 2020-08-26 NOTE — Patient Instructions (Signed)
  Patient is a 45 yr old male with C6 Quadriplegia, neurogenic bowel and bladder, and spasticity here for  F/u on spasticity and quadriplegia. Also has Autonomic dysreflexia.   1. COVID- Can get COVID booster as of now since been 8+ weeks since had COVID. Christopher Valencia   2. Spasticity- Cyproheptadine 4 mg 2x/day now- do this for 1-2 weeks- then 1/2 tab 2x/day x 2 weeks, then 2 mg  Nightly-  X 2 weeks, then can stop.  If cannot wean as much you would like- I'm happy to continue to write for it.   3. Do pressure relief- 2x/hour- for 5 minutes minimum. And fyi, most of my patients do it every 20 minutes, FYI.  4. Agree with pt, to take toenail off- R foot- big toe- because getting caught with socks all the time and causing AD- autonomic dysreflexia. This can be dangerous and needs to be FIXED.   5. Send in refill of Baclofen 10 mg TID/3x/day- 5 refills sent in.   6. Needs Rx for w/c evaluation - by PT-  has cushion.  Pt needs power w/c- he is a C6 quadriplegic patient with injury x 20 years- he is unable to propel manual w/c, and does not walk AT ALL, and needs power w/c for propulsion as well as for pressure relief- he also needs rising ability/ elevation to make transfers easier and needs elevating leg rests due to variable LE edema - also needs tilt in space and recline for orthostasis and Autonomic dysreflexia as well as pressure relief.  Will write for new Rx and referral for pt to get w/c evaluation.   7. F/U in 3 months-double visit- due to SCI.

## 2020-08-26 NOTE — Progress Notes (Signed)
Subjective:    Patient ID: Christopher Valencia, male    DOB: 06-16-1975, 44 y.o.   MRN: [id number]  HPI  Patient is a 45 yr old male with C6 Quadriplegia, neurogenic bowel and bladder, and spasticity here for  F/u on spasticity and quadriplegia.   Can't come off Baclofen,  Wants to come off Cyproheptadine- since took/takes for spasms/trembling.   Last time came, had COVID- when caught with elevated temp-  Only had a sore throat and fever; like cold  Had vaccines, but needs booster.   Had COVID in January- Also had loose stools- for 3-4 weeks.  Wasn't diarrhea- worked with bowel program.    Off Blood thinner- since doesn't have DVT.   Baclofen- 10 mg TID- 9am, 4-5 pm and midnight.  Stays up til 12-2 am nightly.  Cyproheptadine- 4 mg TID- same time as baclofen.   Done pressure relief 1x/hour- and if does, pain is much better.  And feels much better.   Lights have fallen off w/c Springs are "sprung"- no support anymore.  Armrests need replacement.  Has Permobil M 85- 52+ years old.  Electronic panel is "no good"- blinking- [employer] bad cable- for 2+ years- will cut off when it wants to- esp when he's trying to cross the road.  Replaced a bolt raising seat 2-3x, but still not working well.    New cushion- just last year- doing well- Jay2- 39year old  Pain Inventory Average Pain 2 Pain Right Now 2 My pain is sharp, burning, stabbing and tingling  In the last 24 hours, has pain interfered with the following? General activity 2 Relation with others 0 Enjoyment of life 2 What TIME of day is your pain at its worst? morning  Sleep (in general) Fair  Pain is worse with: sitting and inactivity Pain improves with: rest, therapy/exercise and medication Relief from Meds: no ans  ability to climb steps?  no do you drive?  no use a wheelchair needs help with transfers  disabled: date disabled . I need assistance with the following:  dressing, bathing, toileting, meal prep, household  duties and shopping  bladder control problems bowel control problems weakness numbness tingling trouble walking spasms  Any changes since last visit?  no  Any changes since last visit?  no    Family History  Problem Relation Age of Onset  . Hypertension Mother   . Hypertension Father    Social History   Socioeconomic History  . Marital status: Single    Spouse name: Not on file  . Number of children: Not on file  . Years of education: Not on file  . Highest education level: Not on file  Occupational History  . Not on file  Tobacco Use  . Smoking status: Never Smoker  . Smokeless tobacco: Never Used  Substance and Sexual Activity  . Alcohol use: No  . Drug use: No  . Sexual activity: Not Currently  Other Topics Concern  . Not on file  Social History Narrative  . Not on file   Social Determinants of Health   Financial Resource Strain: Not on file  Food Insecurity: Not on file  Transportation Needs: Not on file  Physical Activity: Not on file  Stress: Not on file  Social Connections: Not on file   Past Surgical History:  Procedure Laterality Date  . ABDOMINAL SURGERY    . BACK SURGERY     Past Medical History:  Diagnosis Date  . Paraplegia following spinal cord injury (  HCC)   . Spinal cord injury, C5-C7 (HCC)    BP (!) 152/97   Pulse 94   Temp 97.9 F (36.6 C)   Ht 5\' 10"  (1.778 m) Comment: reported  Wt 198 lb 1.6 oz (89.9 kg) Comment: last weight recorded  SpO2 96%   BMI 28.42 kg/m   Opioid Risk Score:   Fall Risk Score:  `1  Depression screen PHQ 2/9  Depression screen [facility] 2/9 08/26/2020 09/13/2019 10/04/2018  Decreased Interest 0 0 0  Down, Depressed, Hopeless 0 0 0  PHQ - 2 Score 0 0 0   Review of Systems  Constitutional: Negative.   HENT: Negative.   Eyes: Negative.   Respiratory: Negative.   Cardiovascular: Negative.   Gastrointestinal:       Bowel control  Endocrine: Negative.   Genitourinary:       Bladder control   Musculoskeletal: Positive for gait problem.  Skin: Negative.   Allergic/Immunologic: Negative.   Neurological: Positive for weakness and numbness.       Tingling  Hematological: Negative.   Psychiatric/Behavioral: Negative.   All other systems reviewed and are negative.      Objective:   Physical Exam Awake, alert, appropriate, in power w/c, did pressure relief, NAD W/c is kept in good repair by pt, but having problems as listed above,  Dry skin on L hand, not R hand Arm rests too worn- electric panel blinking wrong-  Sent into extensor tone- with leg ROM Neuro: MAS of 2 in LEs- wit few beats clonus B/L      Assessment & Plan:   Patient is a 45 yr old male with C6 Quadriplegia, neurogenic bowel and bladder, and spasticity here for  F/u on spasticity and quadriplegia. Also has Autonomic dysreflexia.   1. COVID- Can get COVID booster as of now since been 8+ weeks since had COVID. 47   2. Spasticity- Cyproheptadine 4 mg 2x/day now- do this for 1-2 weeks- then 1/2 tab 2x/day x 2 weeks, then 2 mg  Nightly-  X 2 weeks, then can stop.  If cannot wean as much you would like- I'm happy to continue to write for it.   3. Do pressure relief- 2x/hour- for 5 minutes minimum. And fyi, most of my patients do it every 20 minutes, FYI.  4. Agree with pt, to take toenail off- R foot- big toe- because getting caught with socks all the time and causing AD- autonomic dysreflexia. This can be dangerous and needs to be FIXED.   5. Send in refill of Baclofen 10 mg TID/3x/day- 5 refills sent in.   6. Needs Rx for w/c evaluation - by PT-  has cushion.  Pt needs power w/c- he is a C6 quadriplegic patient with injury x 20 years- he is unable to propel manual w/c, and does not walk AT ALL, and needs power w/c for propulsion as well as for pressure relief- he also needs rising ability/ elevation to make transfers easier and needs power elevating leg rests due to variable LE edema - also needs tilt in space  and recline for orthostasis and Autonomic dysreflexia as well as pressure relief.  Will write for new Rx and referral for pt to get w/c evaluation.   7. F/U in 3 months-double visit- due to SCI.    I spent a total of 30 minutes discussing w/c's as well as titration/weaning of spasticity meds.

## 2020-10-24 ENCOUNTER — Other Ambulatory Visit: Payer: Self-pay | Admitting: Physical Medicine and Rehabilitation

## 2020-11-27 ENCOUNTER — Encounter: Payer: Medicare Other | Admitting: Physical Medicine and Rehabilitation

## 2021-01-22 ENCOUNTER — Other Ambulatory Visit: Payer: Self-pay | Admitting: Physical Medicine and Rehabilitation

## 2021-01-31 ENCOUNTER — Encounter: Payer: Medicare Other | Admitting: Physical Medicine and Rehabilitation

## 2021-03-12 ENCOUNTER — Encounter
Payer: Medicare Other | Attending: Physical Medicine and Rehabilitation | Admitting: Physical Medicine and Rehabilitation

## 2021-03-12 ENCOUNTER — Encounter: Payer: Self-pay | Admitting: Physical Medicine and Rehabilitation

## 2021-03-12 ENCOUNTER — Other Ambulatory Visit: Payer: Self-pay

## 2021-03-12 VITALS — BP 93/60 | HR 54 | Temp 98.1°F | Ht 70.0 in | Wt 198.1 lb

## 2021-03-12 DIAGNOSIS — G8929 Other chronic pain: Secondary | ICD-10-CM | POA: Diagnosis present

## 2021-03-12 DIAGNOSIS — G8250 Quadriplegia, unspecified: Secondary | ICD-10-CM | POA: Insufficient documentation

## 2021-03-12 DIAGNOSIS — Z993 Dependence on wheelchair: Secondary | ICD-10-CM | POA: Insufficient documentation

## 2021-03-12 DIAGNOSIS — R252 Cramp and spasm: Secondary | ICD-10-CM | POA: Insufficient documentation

## 2021-03-12 DIAGNOSIS — M545 Low back pain, unspecified: Secondary | ICD-10-CM | POA: Diagnosis present

## 2021-03-12 MED ORDER — TRAMADOL HCL 50 MG PO TABS: 50.0000 mg | ORAL_TABLET | Freq: Four times a day (QID) | ORAL | 5 refills | Status: DC | PRN

## 2021-03-12 NOTE — Patient Instructions (Signed)
Patient is a 45 yr old male with C6 Quadriplegia, neurogenic bowel and bladder, and spasticity here for  F/u on spasticity and quadriplegia. Also has Autonomic dysreflexia. Grandmother had colon cancer. Also has chronic back pain- on Tramadol.    Will renew Tramadol 50 mg q6 hours as needed- #120- with 5 refills. - advised pt to NEVER get pain meds from anyone else unless needs ot go to ER for a broken bone. Otherwise, I need to fill pain meds due to contract.    2. Will need a colonoscopy in 2 years- I am happy to help with bowel program- if GI doesn't know what to do.  Don't miss it because of neurogenic bowel.    3. Not a veteran.    4. Will get Cyproheptadine and Baclofen from PCP.   5. No more colotis Sx's- will let me know if needs referral.   6. Needs COVID booster!- Needs to get new one.   7. Needs flu shot- don't get them together! Don't need to have a SCI and get the flu.    8. Due for new w/c in 5 years- will keep track of this.    9.  F/U in 6 months- double appointment- SCI

## 2021-03-12 NOTE — Progress Notes (Signed)
Subjective:    Patient ID: Christopher Valencia, male    DOB: September 25, 1975, 45 y.o.   MRN: [id number]  HPI  Patient is a 45 yr old male with C6 Quadriplegia, neurogenic bowel and bladder, and spasticity here for  F/u on spasticity and quadriplegia. Also has Autonomic dysreflexia.   Here for f/u on quadriplegia and associated issues.    Had a stomach bug that's why missed last appointment and neurogenic bowel.   Everything is good! Can't complain.   Back still bothering him a little-  Almost has it healed-  Thinks hospital bed hurts his back- Had back pain since before SCI.   Got Tramadol 30 day -60 days ago- ran out 2 weeks ago.    Got new w/c- [employer]- got 1 month ago- not a new w/c cushion since got earlier this year.   No skin issues. Took toenail off R big toe- and healed up.    Gets Baclofen and Periactin from PCP.    Grandmother had colon CA- before she died-    Pain Inventory Average Pain 2 Pain Right Now 2 My pain is constant, sharp, burning, dull, stabbing, tingling, and aching  LOCATION OF PAIN  back  BOWEL Number of stools per week: 7 Oral laxative use No  Type of laxative magic bullet Enema or suppository use Yes  History of colostomy No  Incontinent Yes   BLADDER  Bladder incontinence Yes     Mobility use a wheelchair needs help with transfers  Function disabled: date disabled 10/27/1998 I need assistance with the following:  dressing, bathing, toileting, meal prep, household duties, and shopping  Neuro/Psych bladder control problems bowel control problems weakness numbness tremor tingling spasms  Prior Studies Any changes since last visit?  no  Physicians involved in your care Any changes since last visit?  no   Family History  Problem Relation Age of Onset   Hypertension Mother    Hypertension Father    Social History   Socioeconomic History   Marital status: Single    Spouse name: Not on file   Number of children: Not  on file   Years of education: Not on file   Highest education level: Not on file  Occupational History   Not on file  Tobacco Use   Smoking status: Never   Smokeless tobacco: Never  Substance and Sexual Activity   Alcohol use: No   Drug use: No   Sexual activity: Not Currently  Other Topics Concern   Not on file  Social History Narrative   Not on file   Social Determinants of Health   Financial Resource Strain: Not on file  Food Insecurity: Not on file  Transportation Needs: Not on file  Physical Activity: Not on file  Stress: Not on file  Social Connections: Not on file   Past Surgical History:  Procedure Laterality Date   ABDOMINAL SURGERY     BACK SURGERY     Past Medical History:  Diagnosis Date   Paraplegia following spinal cord injury (HCC)    Spinal cord injury, C5-C7 (HCC)    BP 93/60   Pulse (!) 54   Temp 98.1 F (36.7 C)   Ht 5\' 10"  (1.778 m)   Wt 198 lb 1.6 oz (89.9 kg) Comment: last recorded  SpO2 95%   BMI 28.42 kg/m   Opioid Risk Score:   Fall Risk Score:  `1  Depression screen [facility] 2/9  Depression screen [facility] 2/9 03/12/2021 08/26/2020 09/13/2019 10/04/2018  Decreased Interest 0 0 0 0  Down, Depressed, Hopeless 0 0 0 0  PHQ - 2 Score 0 0 0 0     Review of Systems  Constitutional: Negative.   HENT: Negative.    Eyes: Negative.   Respiratory: Negative.    Cardiovascular: Negative.   Gastrointestinal:        Bowel incontinence  Endocrine: Negative.   Genitourinary:        Bladder incontinence  Musculoskeletal:  Positive for back pain.       Spasm  Skin: Negative.   Allergic/Immunologic: Negative.   Neurological:  Positive for tremors, weakness and numbness.       Tingling  Hematological: Negative.   Psychiatric/Behavioral: Negative.    All other systems reviewed and are negative.     Objective:   Physical Exam   Awake, alert, appropriate, in power w/c; new power w/c- joystick on R, NAD Curling of fingers at PIPs- hyperextension  at MCPs B/L- chornic Hoffman's RUE- not LUE Few beats clonus in Les MAS of 2 in LLE and 1+ in RLE     Assessment & Plan:    Patient is a 45 yr old male with C6 Quadriplegia, neurogenic bowel and bladder, and spasticity here for  F/u on spasticity and quadriplegia. Also has Autonomic dysreflexia. Grandmother had colon cancer. Also has chronic back pain- on Tramadol.    Will renew Tramadol 50 mg q6 hours as needed- #120- with 5 refills. - advised pt to NEVER get pain meds from anyone else unless needs ot go to ER for a broken bone. Otherwise, I need to fill pain meds due to contract.    2. Will need a colonoscopy in 2 years- I am happy to help with bowel program- if GI doesn't know what to do.  Don't miss it because of neurogenic bowel.    3. Not a veteran.    4. Will get Cyproheptadine and Baclofen from PCP.   5. No more colitis Sx's- will let me know if needs referral. GI?  6. Needs COVID booster!- Needs to get new one.   7. Needs flu shot- don't get them together! Don't need to have a SCI and get the flu.    8. Due for new w/c in 5 years- will keep track of this.    9.  F/U in 6 months- double appointment- SCI  I spent a total of 21 minutes on visit- educating on prevention issues to maintain level of function.

## 2021-03-29 ENCOUNTER — Other Ambulatory Visit: Payer: Self-pay | Admitting: Physical Medicine and Rehabilitation

## 2021-03-31 ENCOUNTER — Other Ambulatory Visit: Payer: Self-pay | Admitting: *Deleted

## 2021-03-31 MED ORDER — BACLOFEN 10 MG PO TABS
10.0000 mg | ORAL_TABLET | Freq: Three times a day (TID) | ORAL | 1 refills | Status: DC
Start: 2021-03-31 — End: 2021-10-03

## 2021-04-02 ENCOUNTER — Ambulatory Visit: Payer: Medicare Other | Admitting: Physical Medicine and Rehabilitation

## 2021-04-14 ENCOUNTER — Ambulatory Visit: Payer: Medicare Other | Admitting: Physical Medicine and Rehabilitation

## 2021-09-10 ENCOUNTER — Encounter: Payer: Medicare Other | Admitting: Physical Medicine and Rehabilitation

## 2021-10-03 ENCOUNTER — Other Ambulatory Visit: Payer: Self-pay | Admitting: Physical Medicine and Rehabilitation

## 2021-10-13 ENCOUNTER — Other Ambulatory Visit: Payer: Self-pay | Admitting: Physical Medicine and Rehabilitation

## 2021-12-04 ENCOUNTER — Other Ambulatory Visit: Payer: Self-pay | Admitting: Physical Medicine and Rehabilitation

## 2021-12-05 ENCOUNTER — Other Ambulatory Visit: Payer: Self-pay | Admitting: Physical Medicine and Rehabilitation

## 2021-12-05 MED ORDER — TRAMADOL HCL 50 MG PO TABS: 50.0000 mg | ORAL_TABLET | Freq: Four times a day (QID) | ORAL | 3 refills | Status: DC | PRN

## 2022-01-02 ENCOUNTER — Encounter: Payer: Self-pay | Admitting: Physical Medicine and Rehabilitation

## 2022-01-02 ENCOUNTER — Encounter
Payer: Medicare Other | Attending: Physical Medicine and Rehabilitation | Admitting: Physical Medicine and Rehabilitation

## 2022-01-02 VITALS — BP 92/66 | HR 54 | Ht 70.0 in | Wt 185.0 lb

## 2022-01-02 DIAGNOSIS — Z79891 Long term (current) use of opiate analgesic: Secondary | ICD-10-CM

## 2022-01-02 DIAGNOSIS — Z993 Dependence on wheelchair: Secondary | ICD-10-CM

## 2022-01-02 DIAGNOSIS — G8929 Other chronic pain: Secondary | ICD-10-CM

## 2022-01-02 DIAGNOSIS — R252 Cramp and spasm: Secondary | ICD-10-CM | POA: Diagnosis present

## 2022-01-02 DIAGNOSIS — M545 Low back pain, unspecified: Secondary | ICD-10-CM | POA: Diagnosis present

## 2022-01-02 DIAGNOSIS — G8250 Quadriplegia, unspecified: Secondary | ICD-10-CM

## 2022-01-02 DIAGNOSIS — Z5181 Encounter for therapeutic drug level monitoring: Secondary | ICD-10-CM | POA: Diagnosis present

## 2022-01-02 DIAGNOSIS — G894 Chronic pain syndrome: Secondary | ICD-10-CM

## 2022-01-02 MED ORDER — TRAMADOL HCL 50 MG PO TABS: 50.0000 mg | ORAL_TABLET | Freq: Four times a day (QID) | ORAL | 5 refills | Status: DC | PRN

## 2022-01-02 NOTE — Progress Notes (Signed)
Subjective:    Patient ID: Christopher Valencia, male    DOB: 28-Aug-1975, 46 y.o.   MRN: [id number]  HPI Patient is a 46 yr old male with C6 Quadriplegia, neurogenic bowel and bladder, and spasticity here for  F/u on spasticity and quadriplegia. Also has Autonomic dysreflexia. Grandmother had colon cancer. Also has chronic back pain- on Tramadol.   Here for f/u on SCI.    Got flyer for SCI support group, but wasn't able to make it.   Not much going on- since so hot, taking it easy.  Getting out to [employer]- riding around- remodelled it, so "new store".   Pain about the same- takes tramadol-  4-5 days/week takes some tramadol- some days 4-5 pills; some days 1 pill; Some days will take 2 tabs at a time; It evens out.   Bowels going OK- Still using suppository every AM- before breakfast- before gets up.   Spasms about the same.  Back about the same- can "heal up" for 2-4 days, but hospital bed really sets it off.  Sleeping wrong in hospital bed will set it off.   Never had an elevated BP when goes to doctor-  Has had AD with the associated HA- 1x. But not since.   Pain Inventory Average Pain 3 Pain Right Now 3 My pain is constant, sharp, burning, stabbing, and tingling  LOCATION OF PAIN  wrist, fingers, back  BOWEL Number of stools per week: 7 Oral laxative use No  Type of laxative . Enema or suppository use Yes  History of colostomy No  Incontinent Yes   BLADDER Normal In and out cath, frequency . Able to self cath  . Bladder incontinence Yes  Frequent urination No  Leakage with coughing No  Difficulty starting stream No  Incomplete bladder emptying No    Mobility how many minutes can you walk? none ability to climb steps?  no do you drive?  no use a wheelchair needs help with transfers  Function disabled: date disabled 10/27/1998 I need assistance with the following:  dressing, bathing, toileting, meal prep, household duties, and shopping  Neuro/Psych bladder  control problems bowel control problems weakness numbness tremor tingling spasms  Prior Studies Any changes since last visit?  no  Physicians involved in your care Any changes since last visit?  no   Family History  Problem Relation Age of Onset   Hypertension Mother    Hypertension Father    Social History   Socioeconomic History   Marital status: Single    Spouse name: Not on file   Number of children: Not on file   Years of education: Not on file   Highest education level: Not on file  Occupational History   Not on file  Tobacco Use   Smoking status: Never   Smokeless tobacco: Never  Substance and Sexual Activity   Alcohol use: No   Drug use: No   Sexual activity: Not Currently  Other Topics Concern   Not on file  Social History Narrative   Not on file   Social Determinants of Health   Financial Resource Strain: Not on file  Food Insecurity: Not on file  Transportation Needs: Not on file  Physical Activity: Not on file  Stress: Not on file  Social Connections: Not on file   Past Surgical History:  Procedure Laterality Date   ABDOMINAL SURGERY     BACK SURGERY     Past Medical History:  Diagnosis Date   Paraplegia following  spinal cord injury (HCC)    Spinal cord injury, C5-C7 (HCC)    BP (!) 165/114   Pulse (!) 46   Ht 5\' 10"  (1.778 m)   Wt 185 lb (83.9 kg)   SpO2 96%   BMI 26.54 kg/m   Opioid Risk Score:   Fall Risk Score:  `1  Depression screen [facility] 2/9     03/12/2021   11:02 AM 08/26/2020    2:41 PM 09/13/2019   11:19 AM 10/04/2018   10:30 AM  Depression screen PHQ 2/9  Decreased Interest 0 0 0 0  Down, Depressed, Hopeless 0 0 0 0  PHQ - 2 Score 0 0 0 0     Review of Systems  Musculoskeletal:  Positive for back pain.       Wrist, fingers pain  All other systems reviewed and are negative.     Objective:   Physical Exam BP 165/114 today and HR 47  Awake, alert, appropriate; in power w/c; joystick on R; ankles tied and belt  around thighs as well;  NAD Hands/fingers contracting some- not severe -at PIPs/DIPs Hoffman's B/L (+) Not bad muscle atrophy in legs Multiple spasms seen- not not severe Going into extensor tone when try to assess legs B/L  No muscle movement in LEs      Assessment & Plan:   Patient is a 46 yr old male with C6 Quadriplegia, 2000 neurogenic bowel and bladder, and spasticity here for  F/u on spasticity and quadriplegia. Also has Autonomic dysreflexia. Grandmother had colon cancer. Also has chronic back pain- on Tramadol.   Pt's BP is 165s/110s today- he's in Autonomic dysreflexia. Was "bumping around" when came in-was having spasm when got BP checked today.   2. Can get a twin mattress topper- gel or memory foam- might help back pain.    3. Get a BP cuff- gets nasal congestion- headache; sweating above level; sense of anxiety- those are all Symptoms of AD- check BP IF have any of those Symptoms.   27. W/c is 44 year old- not due for 4 years  5. Spasms not painful- so doesn't want to increase spasticity meds- thinks spasms are helpful- to get off buttocks and keep some muscle tone.   6. Discussed SCI support group- last Thursday 6-7 pm at [facility]   7. No pressure ulcers- has sensation and can tell when needs to move.    8. Con't Tramadol- needs Oral drug screen   9. Refill Tramadol 50 mg q6 hours prn #120 5 refills.   10. F/U 4 months- double appointment   I spent a total of 32   minutes on total care today- >50% coordination of care- due to discussing SCI issues; as well as oral drug screen and tramadol- also how to help back pain some.  Also discussing Autonomic dysreflexia.

## 2022-01-02 NOTE — Patient Instructions (Signed)
Patient is a 47 yr old male with C6 Quadriplegia, 2000 neurogenic bowel and bladder, and spasticity here for  F/u on spasticity and quadriplegia. Also has Autonomic dysreflexia. Grandmother had colon cancer. Also has chronic back pain- on Tramadol.   Pt's BP is 165s/110s today- he's in Autonomic dysreflexia. Was "bumping around" when came in-was having spasm when got BP checked today.   2. Can get a twin mattress topper- gel or memory foam- might help back pain.    3. Get a BP cuff- gets nasal congestion- headache; sweating above level; sense of anxiety- those are all Symptoms of AD- check BP IF have any of those Symptoms.   70. W/c is 44 year old- not due for 4 years  5. Spasms not painful- so doesn't want to increase spasticity meds- thinks spasms are helpful- to get off buttocks and keep some muscle tone.   6. Discussed SCI support group- last Thursday 6-7 pm at [facility].   7. No pressure ulcers- has sensation and can tell when needs to move.    8. Con't Tramadol- needs Oral drug screen   9. Refill Tramadol 50 mg q6 hours prn #120 5 refills.   10. F/U 4 months- double appointment

## 2022-01-08 LAB — DRUG TOX MONITOR 1 W/CONF, ORAL FLD
Amphetamines: NEGATIVE ng/mL (ref <10)
Barbiturates: NEGATIVE ng/mL (ref <10)
Benzodiazepines: NEGATIVE ng/mL (ref <0.50)
Buprenorphine: NEGATIVE ng/mL (ref <0.10)
Cocaine: NEGATIVE ng/mL (ref <5.0)
Fentanyl: NEGATIVE ng/mL (ref <0.10)
Heroin Metabolite: NEGATIVE ng/mL (ref <1.0)
MARIJUANA: NEGATIVE ng/mL (ref <2.5)
MDMA: NEGATIVE ng/mL (ref <10)
Meprobamate: NEGATIVE ng/mL (ref <2.5)
Methadone: NEGATIVE ng/mL (ref <5.0)
Nicotine Metabolite: NEGATIVE ng/mL (ref <5.0)
Opiates: NEGATIVE ng/mL (ref <2.5)
Phencyclidine: NEGATIVE ng/mL (ref <10)
Tapentadol: NEGATIVE ng/mL (ref <5.0)
Tramadol: 31.5 ng/mL — ABNORMAL HIGH (ref <5.0)
Tramadol: POSITIVE ng/mL — AB (ref <5.0)
Zolpidem: NEGATIVE ng/mL (ref <5.0)

## 2022-01-08 LAB — DRUG TOX ALC METAB W/CON, ORAL FLD: Alcohol Metabolite: NEGATIVE ng/mL (ref <25)

## 2022-01-12 ENCOUNTER — Telehealth: Payer: Self-pay | Admitting: *Deleted

## 2022-01-12 NOTE — Telephone Encounter (Signed)
Oral swab drug screen was consistent for prescribed medications.  ?

## 2022-04-02 ENCOUNTER — Other Ambulatory Visit: Payer: Self-pay | Admitting: Physical Medicine and Rehabilitation

## 2022-05-01 IMAGING — DX DG CHEST 1V PORT
2 series · 2 of 2 positions shown · non-contrast
Comparison: 02/17/2020 and previous

CLINICAL DATA: fever and tremors. Pt has hx of cervical injury and
is paraplegic. Back pain,weak

EXAM:
PORTABLE CHEST - 1 VIEW

[chest ap (1 of 2)]
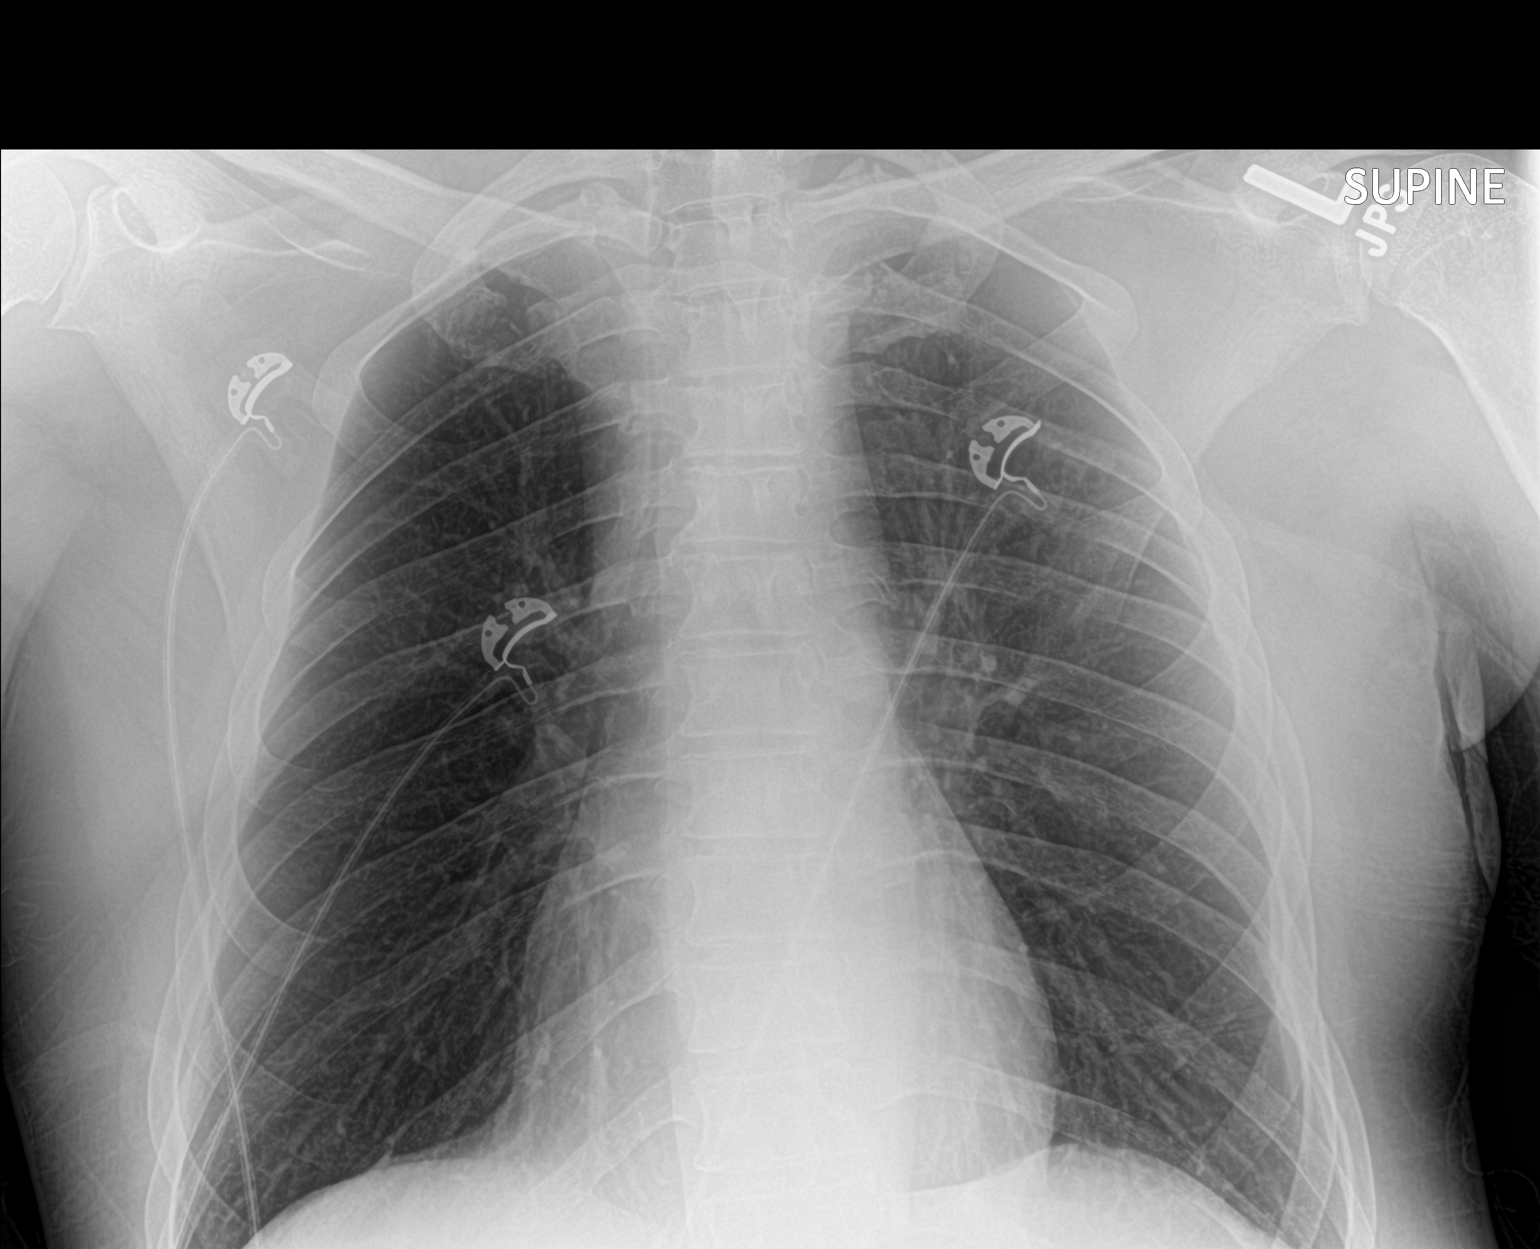

[chest ap (2 of 2)]
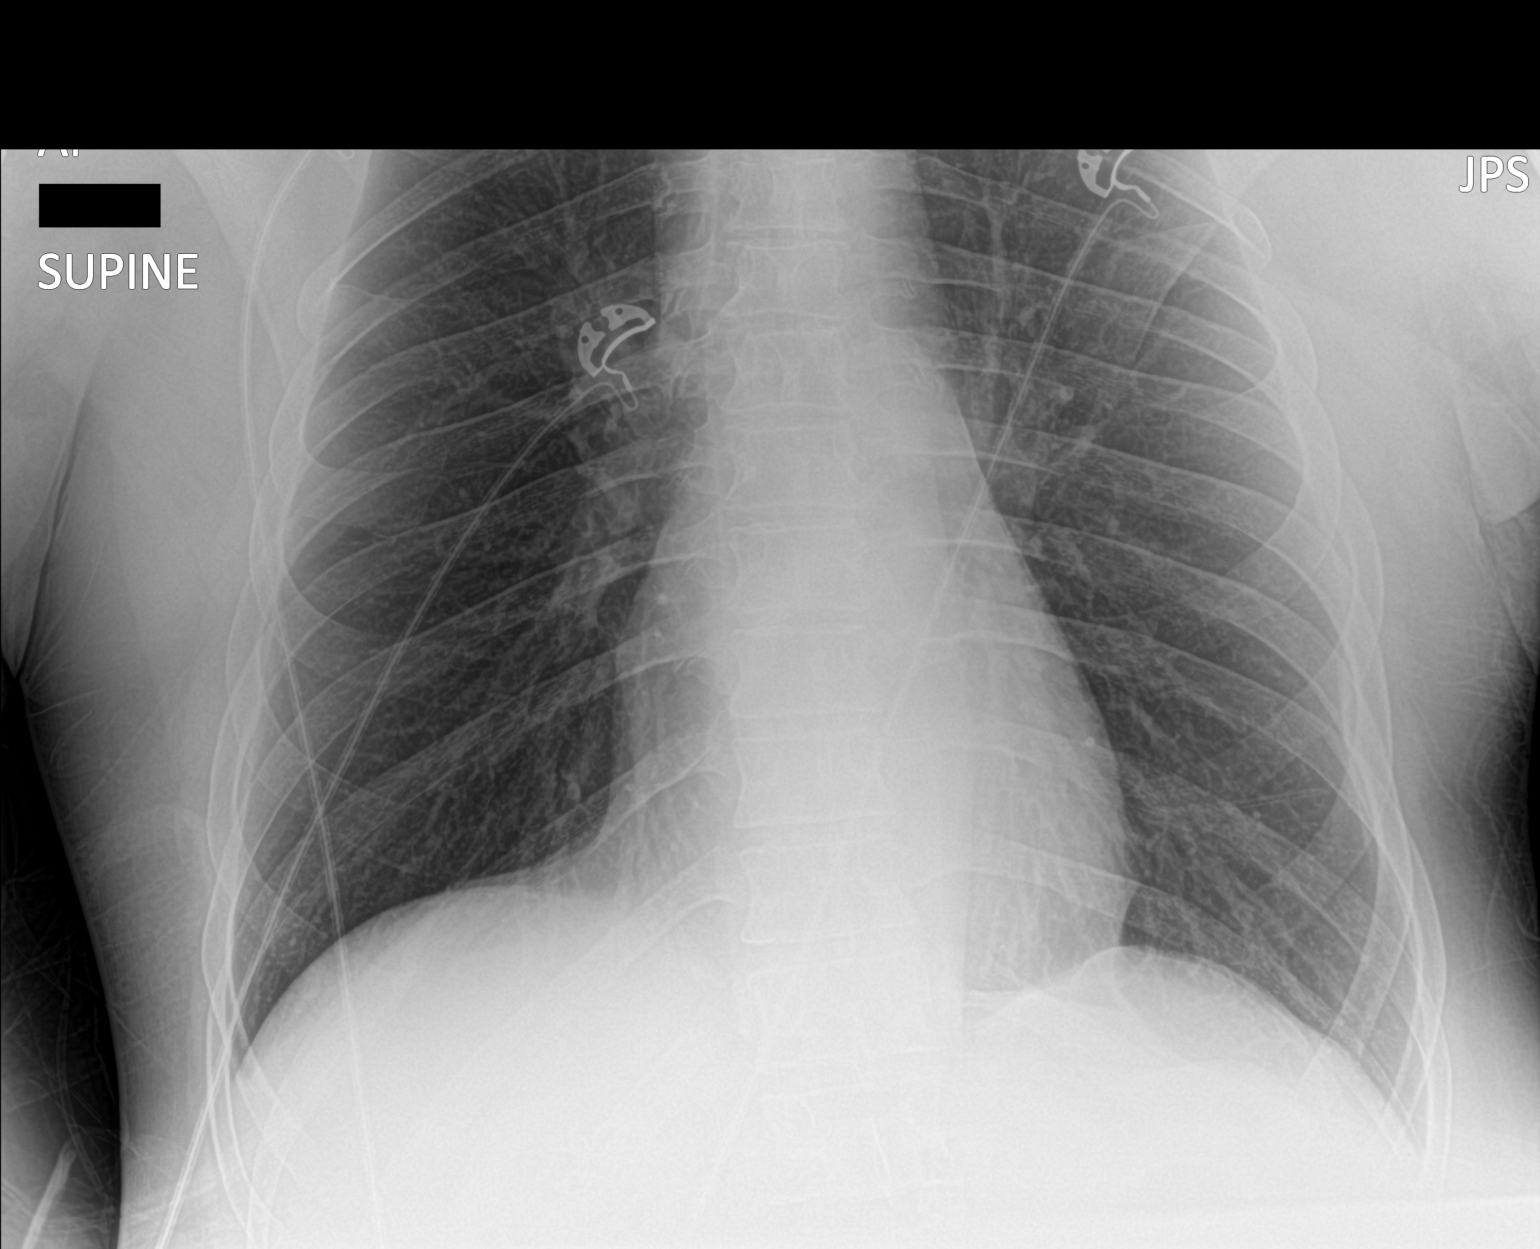

[2 of 2 positions shown; findings below may reference images not displayed]

FINDINGS: Lungs are clear.

Heart size and mediastinal contours are within normal limits.

No effusion.  No pneumothorax.

Cervical fixation hardware partially visualized.
IMPRESSION: No acute cardiopulmonary disease.

## 2022-05-01 IMAGING — CT CT CHEST W/ CM
2 of 5 series · 15 of 36 positions shown, 18 images · IV contrast (APPLIED)
Comparison: None.

CLINICAL DATA: Status post trauma.

EXAM:
CT CHEST, ABDOMEN, AND PELVIS WITH CONTRAST
TECHNIQUE: Multidetector CT imaging of the chest, abdomen and pelvis was
performed following the standard protocol during bolus
administration of intravenous contrast.
CONTRAST:  100mL OMNIPAQUE IOHEXOL 300 MG/ML  SOLN

[Series 505: thins · axial · 0.93mm/px · z∈[+854,+1442]mm · 12 of 817 slices shown, 15 images]
[im 41/817  mediastinal]
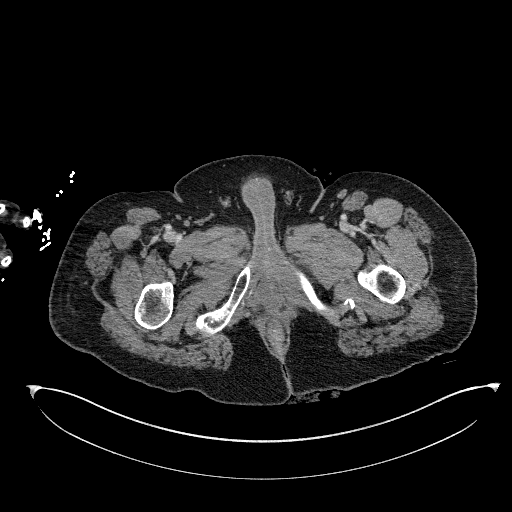
[im 41/817  lung]
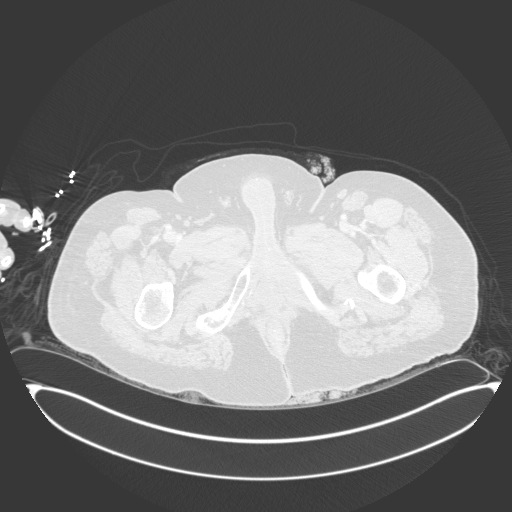
[im 123/817  lung]
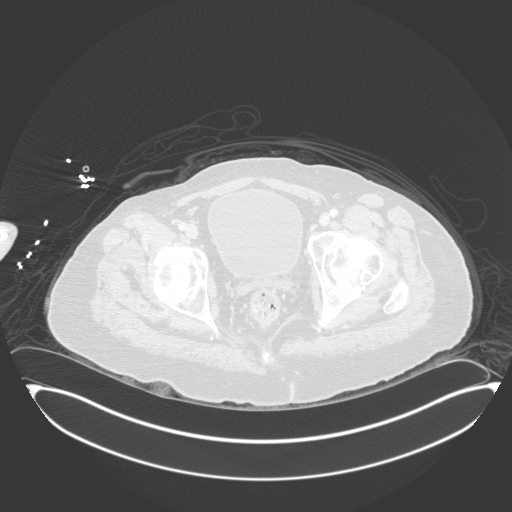
[im 164/817  lung]
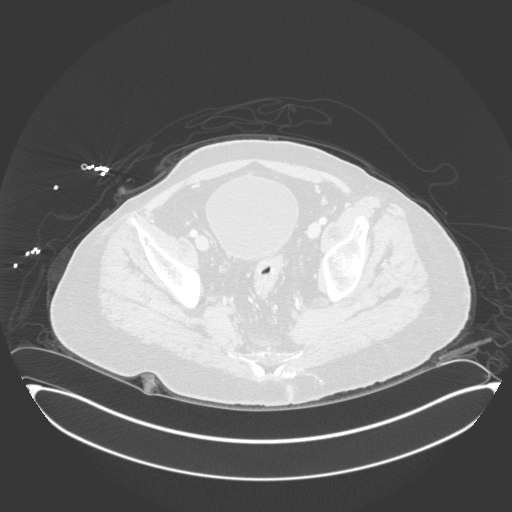
[im 245/817  lung]
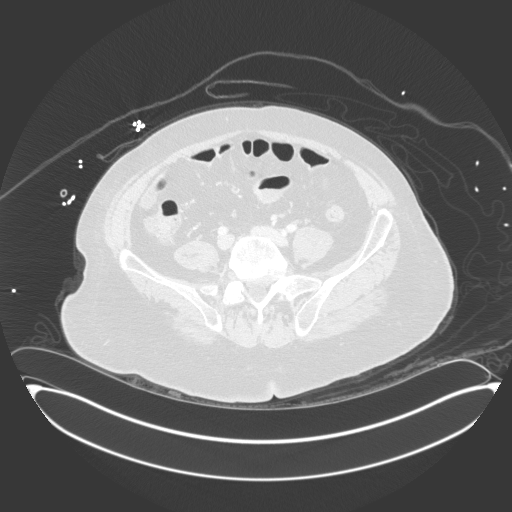
[im 327/817  mediastinal]
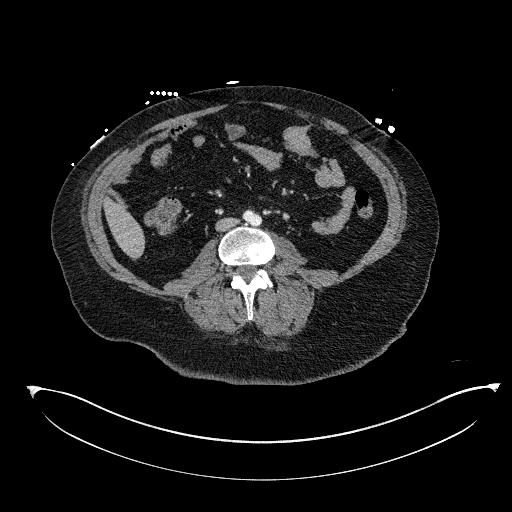
[im 327/817  lung]
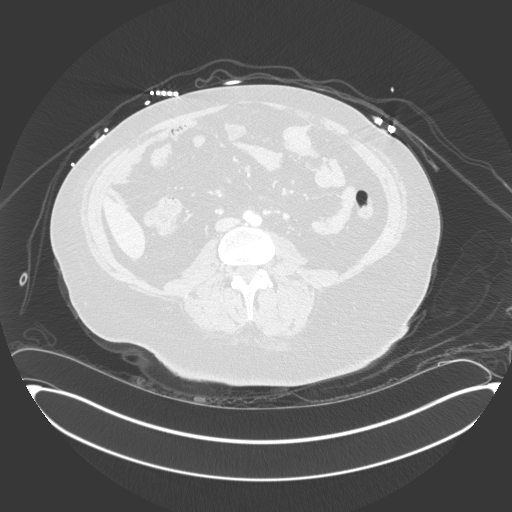
[im 368/817  lung]
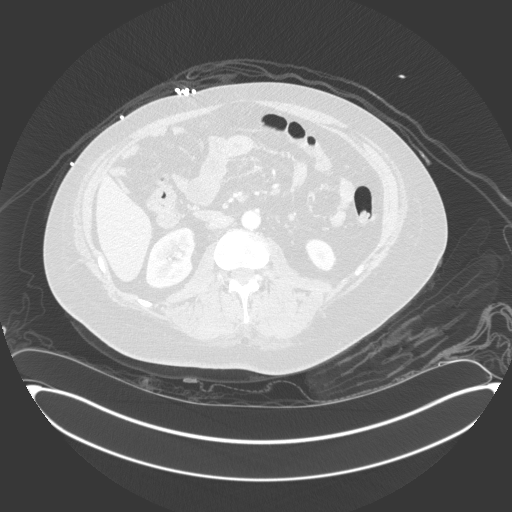
[im 449/817  lung]
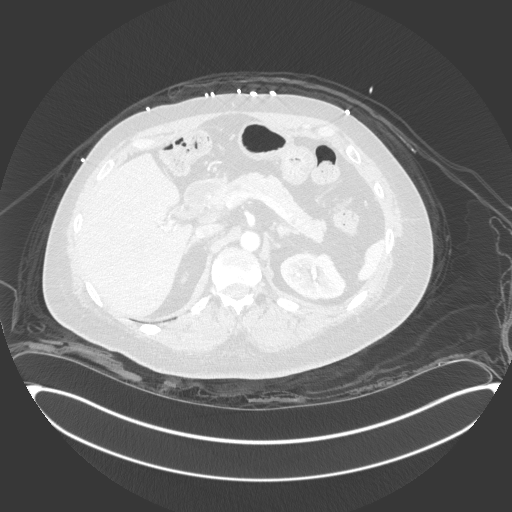
[im 490/817  lung]
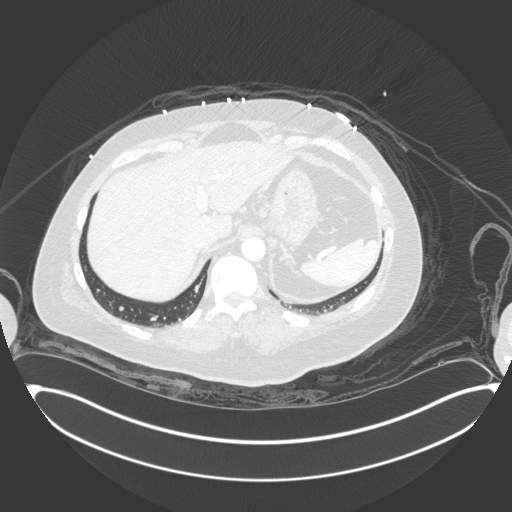
[im 572/817  mediastinal]
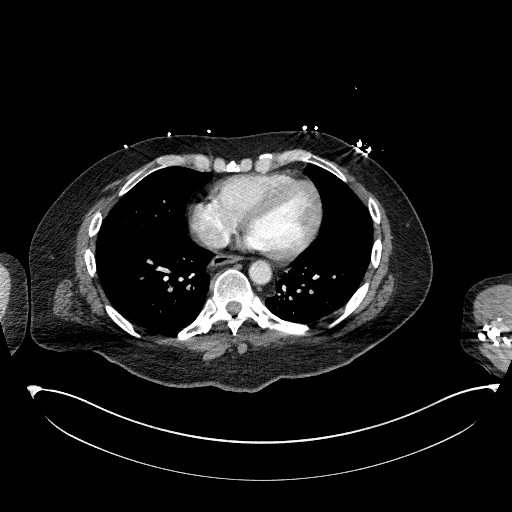
[im 572/817  lung]
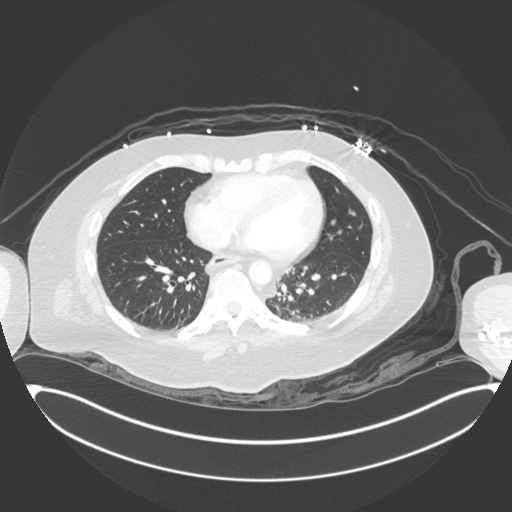
[im 653/817  lung]
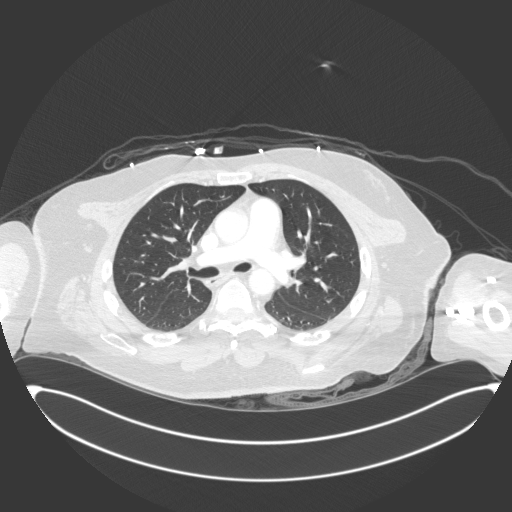
[im 694/817  lung]
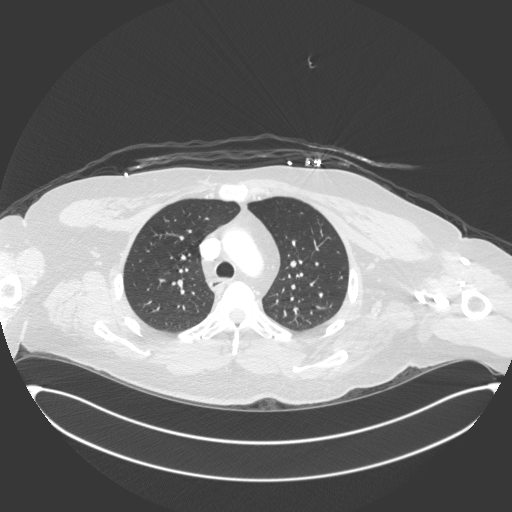
[im 776/817  lung]
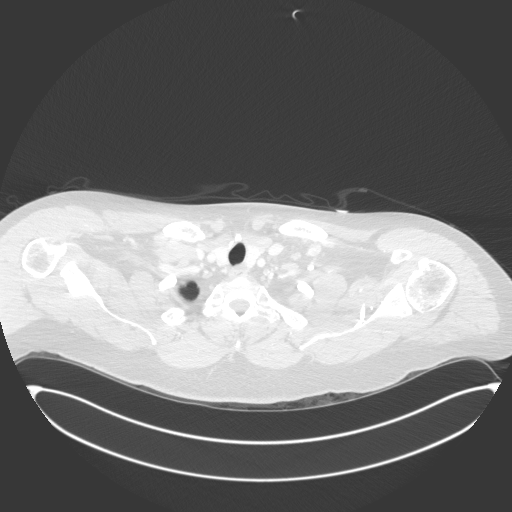

[Series 506: coronal · coronal · 0.96mm/px · 3 of 163 slices shown]
[im 33/163  lung]
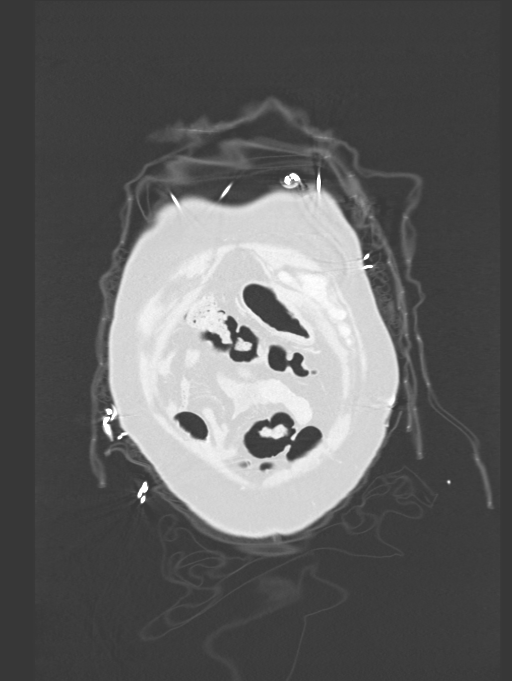
[im 65/163  lung]
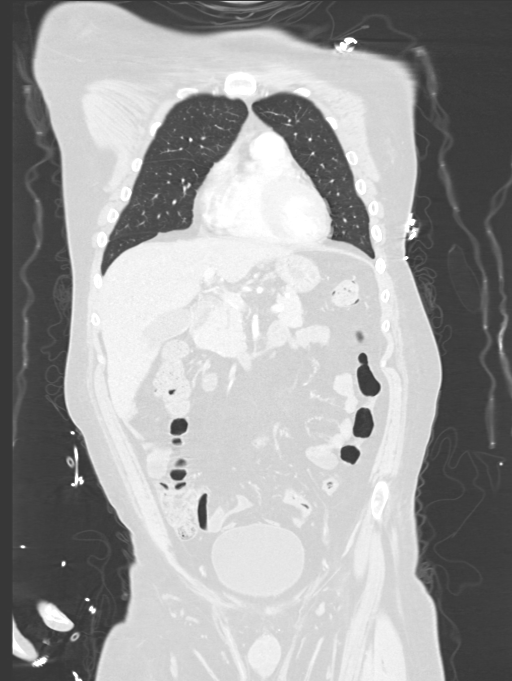
[im 98/163  lung]
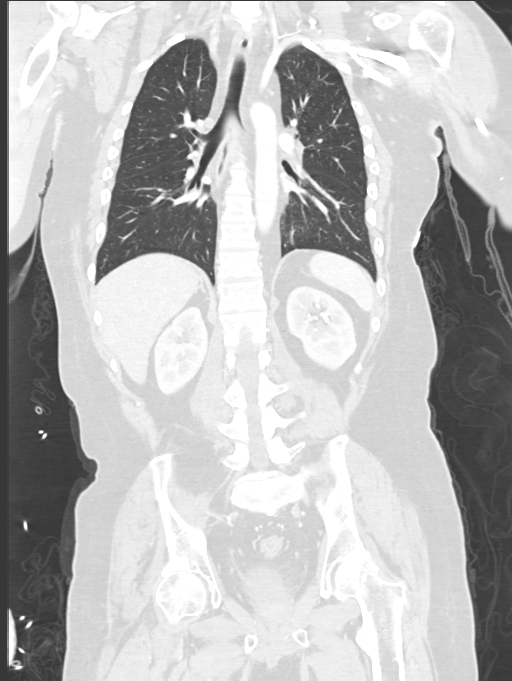

[15 of 36 positions shown; findings below may reference images not displayed]

FINDINGS: CT CHEST FINDINGS

Cardiovascular: No significant vascular findings. Normal heart size.
No pericardial effusion.

Mediastinum/Nodes: No enlarged mediastinal, hilar, or axillary lymph
nodes. Thyroid gland, trachea, and esophagus demonstrate no
significant findings.

Lungs/Pleura: Very mild scarring and/or atelectasis is seen within
the posteromedial aspect of the left lung base. There is no evidence
of a pleural effusion or pneumothorax.

Musculoskeletal: No chest wall mass or suspicious bone lesions
identified.

CT ABDOMEN PELVIS FINDINGS

Hepatobiliary: No focal liver abnormality is seen. No gallstones,
gallbladder wall thickening, or biliary dilatation.

Pancreas: Unremarkable. No pancreatic ductal dilatation or
surrounding inflammatory changes.

Spleen: Normal in size without focal abnormality.

Adrenals/Urinary Tract: Adrenal glands are unremarkable. Kidneys are
normal, without renal calculi, focal lesion, or hydronephrosis.
Bladder is unremarkable.

Stomach/Bowel: Stomach is within normal limits. Appendix appears
normal. No evidence of bowel dilatation. Mild thickening of the mid
to distal sigmoid colon is seen.

Vascular/Lymphatic: No significant vascular findings are present. No
enlarged abdominal or pelvic lymph nodes.

Reproductive: The prostate gland is mildly enlarged.

Other: No abdominal wall hernia or abnormality. No abdominopelvic
ascites.

Musculoskeletal: No acute or significant osseous findings.
IMPRESSION: 1. No evidence of acute or active cardiopulmonary disease.

2. Mild thickening of the mid to distal sigmoid colon which may
represent sequelae associated with mild colitis.

## 2022-05-08 ENCOUNTER — Encounter: Payer: Medicare Other | Admitting: Physical Medicine and Rehabilitation

## 2022-06-10 ENCOUNTER — Encounter: Payer: Medicare Other | Attending: Registered Nurse | Admitting: Registered Nurse

## 2022-06-10 ENCOUNTER — Encounter: Payer: Self-pay | Admitting: Registered Nurse

## 2022-06-10 VITALS — BP 87/57 | HR 64 | Ht 70.0 in | Wt 180.0 lb

## 2022-06-10 DIAGNOSIS — Z5181 Encounter for therapeutic drug level monitoring: Secondary | ICD-10-CM | POA: Diagnosis present

## 2022-06-10 DIAGNOSIS — K592 Neurogenic bowel, not elsewhere classified: Secondary | ICD-10-CM | POA: Diagnosis present

## 2022-06-10 DIAGNOSIS — G8929 Other chronic pain: Secondary | ICD-10-CM | POA: Diagnosis not present

## 2022-06-10 DIAGNOSIS — G894 Chronic pain syndrome: Secondary | ICD-10-CM | POA: Diagnosis not present

## 2022-06-10 DIAGNOSIS — R252 Cramp and spasm: Secondary | ICD-10-CM

## 2022-06-10 DIAGNOSIS — Z79891 Long term (current) use of opiate analgesic: Secondary | ICD-10-CM

## 2022-06-10 DIAGNOSIS — G8250 Quadriplegia, unspecified: Secondary | ICD-10-CM

## 2022-06-10 DIAGNOSIS — Z993 Dependence on wheelchair: Secondary | ICD-10-CM | POA: Diagnosis not present

## 2022-06-10 DIAGNOSIS — M545 Low back pain, unspecified: Secondary | ICD-10-CM | POA: Insufficient documentation

## 2022-06-10 MED ORDER — TRAMADOL HCL 50 MG PO TABS: 50.0000 mg | ORAL_TABLET | Freq: Four times a day (QID) | ORAL | 5 refills | Status: DC | PRN

## 2022-06-10 NOTE — Progress Notes (Signed)
Subjective:    Patient ID: Christopher Valencia, male    DOB: 1975-07-18, 47 y.o.   MRN: [id number]  HPI: Christopher Valencia is a 47 y.o. male who returns for follow up appointment for chronic pain and medication refill. He states his pain is located in his lower back. He. rates his pain 3. His current exercise regime is performing stretching exercises.  Christopher Valencia arrived hypotensive, blood pressure was re-checked, he will keep blood pressure log and report readings to his PCP. He verbalizes understanding.   Christopher Valencia Morphine equivalent is 20.00 MME.   Oral Swab was Performed today Pain Inventory Average Pain 2 Pain Right Now 3 My pain is sharp, burning, dull, stabbing, tingling, and aching  In the last 24 hours, has pain interfered with the following? General activity 5 Relation with others 3 Enjoyment of life 3 What TIME of day is your pain at its worst? morning  Sleep (in general) Fair  Pain is worse with:  bed Pain improves with: medication Relief from Meds: 7  Family History  Problem Relation Age of Onset   Hypertension Mother    Hypertension Father    Social History   Socioeconomic History   Marital status: Single    Spouse name: Not on file   Number of children: Not on file   Years of education: Not on file   Highest education level: Not on file  Occupational History   Not on file  Tobacco Use   Smoking status: Never   Smokeless tobacco: Never  Substance and Sexual Activity   Alcohol use: No   Drug use: No   Sexual activity: Not Currently  Other Topics Concern   Not on file  Social History Narrative   Not on file   Social Determinants of Health   Financial Resource Strain: Not on file  Food Insecurity: Not on file  Transportation Needs: Not on file  Physical Activity: Not on file  Stress: Not on file  Social Connections: Not on file   Past Surgical History:  Procedure Laterality Date   ABDOMINAL SURGERY     BACK SURGERY     Past Surgical History:   Procedure Laterality Date   ABDOMINAL SURGERY     BACK SURGERY     Past Medical History:  Diagnosis Date   Paraplegia following spinal cord injury ([place])    Spinal cord injury, C5-C7 ([place])    BP (!) 86/59   Pulse 64   Ht 5\' 10"  (1.778 m)   Wt 180 lb (81.6 kg) Comment: pt states, in wheelchair  SpO2 96%   BMI 25.83 kg/m   Opioid Risk Score:   Fall Risk Score:  `1  Depression screen [facility] 2/9     03/12/2021   11:02 AM 08/26/2020    2:41 PM 09/13/2019   11:19 AM 10/04/2018   10:30 AM  Depression screen PHQ 2/9  Decreased Interest 0 0 0 0  Down, Depressed, Hopeless 0 0 0 0  PHQ - 2 Score 0 0 0 0      Review of Systems  Musculoskeletal:  Positive for back pain.  All other systems reviewed and are negative.     Objective:   Physical Exam Vitals and nursing note reviewed.  Constitutional:      Appearance: Normal appearance.  Cardiovascular:     Rate and Rhythm: Normal rate and regular rhythm.     Pulses: Normal pulses.     Heart sounds: Normal heart sounds.  Pulmonary:     Effort: Pulmonary effort is normal.     Breath sounds: Normal breath sounds.  Musculoskeletal:     Cervical back: Normal range of motion and neck supple.     Comments: Normal Muscle Bulk and Muscle Testing Reveals:  Upper Extremities: Full ROM and Muscle Strength 0/5 Lower Extremities: Paralysis Arrived in wheelchair      Skin:    General: Skin is warm and dry.  Neurological:     Mental Status: He is alert and oriented to person, place, and time.  Psychiatric:        Mood and Affect: Mood normal.        Behavior: Behavior normal.         Assessment & Plan:  Chronic Low Back Pain: Continue HEP as Tolerated. Continue current medication regimen.  Quadriplegia: Continue to Monitor.  Muscle Spasticity: Continue current medication regimen. Continue to Monitor.  Wheelchair Dependent: Continue to Monitor.  Hypotensive: Blood Pressure re-checked. He was instructed to keep blood Pressure Log:  Report readings to his PCP he verbalizes understanding.  Neurogenic Bowel: He is following Bowel Program he reports. Continue to Monitor  F/U with Dr Dagoberto Ligas

## 2022-06-13 LAB — DRUG TOX MONITOR 1 W/CONF, ORAL FLD
Amphetamines: NEGATIVE ng/mL (ref <10)
Barbiturates: NEGATIVE ng/mL (ref <10)
Benzodiazepines: NEGATIVE ng/mL (ref <0.50)
Buprenorphine: NEGATIVE ng/mL (ref <0.10)
Cocaine: NEGATIVE ng/mL (ref <5.0)
Fentanyl: NEGATIVE ng/mL (ref <0.10)
Heroin Metabolite: NEGATIVE ng/mL (ref <1.0)
MARIJUANA: NEGATIVE ng/mL (ref <2.5)
MDMA: NEGATIVE ng/mL (ref <10)
Meprobamate: NEGATIVE ng/mL (ref <2.5)
Methadone: NEGATIVE ng/mL (ref <5.0)
Opiates: NEGATIVE ng/mL (ref <2.5)
Phencyclidine: NEGATIVE ng/mL (ref <10)
Tapentadol: NEGATIVE ng/mL (ref <5.0)
Tramadol: 344.4 ng/mL — ABNORMAL HIGH (ref <5.0)
Tramadol: POSITIVE ng/mL — AB (ref <5.0)
Zolpidem: NEGATIVE ng/mL (ref <5.0)

## 2022-06-13 LAB — DRUG TOX ALC METAB W/CON, ORAL FLD: Alcohol Metabolite: NEGATIVE ng/mL (ref <25)

## 2022-07-06 ENCOUNTER — Telehealth: Payer: Self-pay

## 2022-07-06 NOTE — Telephone Encounter (Signed)
PA for Tramadol submitted 

## 2022-07-09 ENCOUNTER — Telehealth: Payer: Self-pay | Admitting: Physical Medicine and Rehabilitation

## 2022-07-09 MED ORDER — TRAMADOL HCL 50 MG PO TABS: 50.0000 mg | ORAL_TABLET | Freq: Four times a day (QID) | ORAL | 1 refills | Status: DC | PRN

## 2022-07-09 NOTE — Telephone Encounter (Signed)
Patient needs a refill on Tramadol. 

## 2022-07-10 NOTE — Telephone Encounter (Signed)
Tramadol denied

## 2022-08-10 ENCOUNTER — Encounter: Payer: Medicare Other | Attending: Registered Nurse | Admitting: Physical Medicine and Rehabilitation

## 2022-08-10 ENCOUNTER — Encounter: Payer: Self-pay | Admitting: Physical Medicine and Rehabilitation

## 2022-08-10 VITALS — BP 128/85 | HR 61 | Ht 70.0 in

## 2022-08-10 DIAGNOSIS — Z993 Dependence on wheelchair: Secondary | ICD-10-CM | POA: Insufficient documentation

## 2022-08-10 DIAGNOSIS — G8929 Other chronic pain: Secondary | ICD-10-CM | POA: Insufficient documentation

## 2022-08-10 DIAGNOSIS — Z79891 Long term (current) use of opiate analgesic: Secondary | ICD-10-CM | POA: Insufficient documentation

## 2022-08-10 DIAGNOSIS — Z5181 Encounter for therapeutic drug level monitoring: Secondary | ICD-10-CM | POA: Insufficient documentation

## 2022-08-10 DIAGNOSIS — G8250 Quadriplegia, unspecified: Secondary | ICD-10-CM | POA: Insufficient documentation

## 2022-08-10 DIAGNOSIS — G894 Chronic pain syndrome: Secondary | ICD-10-CM | POA: Insufficient documentation

## 2022-08-10 DIAGNOSIS — M545 Low back pain, unspecified: Secondary | ICD-10-CM | POA: Diagnosis present

## 2022-08-10 MED ORDER — PREDNISONE 20 MG PO TABS: 20.0000 mg | ORAL_TABLET | Freq: Every day | ORAL | 0 refills | Status: DC

## 2022-08-10 MED ORDER — DIAZEPAM 5 MG PO TABS: 5.0000 mg | ORAL_TABLET | Freq: Every day | ORAL | 0 refills | Status: DC

## 2022-08-10 NOTE — Patient Instructions (Signed)
Patient is a 46 yr old male with C6 Quadriplegia, neurogenic bowel and bladder, and spasticity here for  F/u on spasticity and quadriplegia. Also has Autonomic dysreflexia. Grandmother had colon cancer. Also has chronic back pain- on Tramadol.     SCI support group last Thursday of the month- 6-7 pm at [address] in first floor conference room past deli/gym area.   2. For pulled muscle in back, will try Prednisone 20 mg daily x 4 days- as well as Valium 5 mg NIGHTLY for 7 days- and if still bothering him can take for a total of 14 days, but not longer.  Steroid is to calm down inflammation and valium to relax muscles.  Discussed options to help pulled muscle in back.   3. Can get hospital bed from me in future.    4. Needs new tires/casters  new casters- needs new back cushion- cover is torn-  and and arm rest- since also broken- [facility] is his provider - will write Rx for w/c repairs.   5. Con't Tramadol just got refill 1 month ago.    6. Oral drug screen today to f/u on Drug screen since on tramadol.   7. Con't Baclofen and Cyproheptadine at PCP- doesn't need refills  8. No pressure ulcers- doing well  9. Hasn't had AD lately- has BP cuff now- is doing appropriate treatment.   10. F/U in 3 months- double visit- SCI

## 2022-08-10 NOTE — Progress Notes (Signed)
Subjective:    Patient ID: Christopher Valencia, male    DOB: 10-Nov-1975, 47 y.o.   MRN: [id number]  HPI Patient is a 47 yr old male with C6 Quadriplegia, neurogenic bowel and bladder, and spasticity here for  F/u on spasticity and quadriplegia. Also has Autonomic dysreflexia. Grandmother had colon cancer. Also has chronic back pain- on Tramadol.   Here for f/u on SCI.    Spasms about the same.   Ever since pulled a muscle in back- has had to take is easy and rest a lot-  after 2-3 days, back to normal, but 4th day, depends on way sleep, back started again.   Only meds he takes are Cyproheptadine 3x/day- for spasms Tramadol-  Baclofen 10 mg TID- actually 5 mg TID sometimes.    Has gained weight lately, and could be Periactin-    Took forever to get paperwork right for new hospital bed- took forever.     Pain Inventory Average Pain 4 Pain Right Now 5 My pain is constant, sharp, burning, dull, stabbing, tingling, and aching  LOCATION OF PAIN  Lower back pain  BOWEL Number of stools per week: 7 Oral laxative use Yes  Type of laxative Miralax Enema or suppository use Yes    BLADDER Pads    Mobility use a wheelchair needs help with transfers Do you have any goals in this area?  yes  Function disabled: date disabled 2000 I need assistance with the following:  dressing, bathing, toileting, meal prep, household duties, and shopping Do you have any goals in this area?  yes  Neuro/Psych weakness numbness tremor tingling trouble walking spasms  Prior Studies Any changes since last visit?  yes  Physicians involved in your care Any changes since last visit?  no   Family History  Problem Relation Age of Onset   Hypertension Mother    Hypertension Father    Social History   Socioeconomic History   Marital status: Single    Spouse name: Not on file   Number of children: Not on file   Years of education: Not on file   Highest education level: Not on file   Occupational History   Not on file  Tobacco Use   Smoking status: Never   Smokeless tobacco: Never  Vaping Use   Vaping Use: Not on file  Substance and Sexual Activity   Alcohol use: No   Drug use: No   Sexual activity: Not Currently  Other Topics Concern   Not on file  Social History Narrative   Not on file   Social Determinants of Health   Financial Resource Strain: Not on file  Food Insecurity: Not on file  Transportation Needs: Not on file  Physical Activity: Not on file  Stress: Not on file  Social Connections: Not on file   Past Surgical History:  Procedure Laterality Date   ABDOMINAL SURGERY     BACK SURGERY     Past Medical History:  Diagnosis Date   Paraplegia following spinal cord injury ([place])    Spinal cord injury, C5-C7 ([place])    BP 128/85   Pulse 61   Ht '5\' 10"'$  (1.778 m)   BMI 25.83 kg/m   Opioid Risk Score:   Fall Risk Score:  `1  Depression screen [facility] 2/9     08/10/2022   11:37 AM 03/12/2021   11:02 AM 08/26/2020    2:41 PM 09/13/2019   11:19 AM 10/04/2018   10:30 AM  Depression screen  PHQ 2/9  Decreased Interest 0 0 0 0 0  Down, Depressed, Hopeless 0 0 0 0 0  PHQ - 2 Score 0 0 0 0 0    Review of Systems  Musculoskeletal:  Positive for gait problem.       Spasms  Neurological:  Positive for tremors, weakness and numbness.       Spasms,tingling  All other systems reviewed and are negative.      Objective:   Physical Exam  Awake, alert, appropriate, in power w/c; joystick on R, NAD Broken L arm rest Rim around edge broke off on lateral edge Casters BARE Neuro: MAS of 2 in LE's Few beats clonus in LE's Fingers over curled- at PIPs.      Assessment & Plan:   Patient is a 48 yr old male with C6 Quadriplegia, neurogenic bowel and bladder, and spasticity here for  F/u on spasticity and quadriplegia. Also has Autonomic dysreflexia. Grandmother had colon cancer. Also has chronic back pain- on Tramadol.     SCI support group last  Thursday of the month- 6-7 pm at [address] in first floor conference room past deli/gym area.   2. For pulled muscle in back, will try Prednisone 20 mg daily x 4 days- as well as Valium 5 mg NIGHTLY for 7 days- and if still bothering him can take for a total of 14 days, but not longer.  Steroid is to calm down inflammation and valium to relax muscles.  Discussed options to help pulled muscle in back.   3. Can get hospital bed from me in future.    4. Needs new tires/casters  new casters- needs new back cushion- cover is torn-  and and arm rest- since also broken- [facility] is his provider - will write Rx for w/c repairs.   5. Con't Tramadol just got refill 1 month ago.    6. Oral drug screen today to f/u on Drug screen since on tramadol.   7. Con't Baclofen and Cyproheptadine at PCP- doesn't need refills  8. No pressure ulcers- doing well  9. Hasn't had AD lately- has BP cuff now- is doing appropriate treatment.   10. F/U in 3 months- double visit- SCI   I spent a total of  30  minutes on total care today- >50% coordination of care- due to d/w [facility] about w/c repairs as well as oral drug screen and discussion about pain and SCI support group and writing rx

## 2022-08-14 LAB — DRUG TOX ALC METAB W/CON, ORAL FLD: Alcohol Metabolite: NEGATIVE ng/mL (ref <25)

## 2022-08-14 LAB — DRUG TOX MONITOR 1 W/CONF, ORAL FLD
Amphetamines: NEGATIVE ng/mL (ref <10)
Barbiturates: NEGATIVE ng/mL (ref <10)
Benzodiazepines: NEGATIVE ng/mL (ref <0.50)
Buprenorphine: NEGATIVE ng/mL (ref <0.10)
Cocaine: NEGATIVE ng/mL (ref <5.0)
Fentanyl: NEGATIVE ng/mL (ref <0.10)
Heroin Metabolite: NEGATIVE ng/mL (ref <1.0)
MARIJUANA: NEGATIVE ng/mL (ref <2.5)
MDMA: NEGATIVE ng/mL (ref <10)
Meprobamate: NEGATIVE ng/mL (ref <2.5)
Methadone: NEGATIVE ng/mL (ref <5.0)
Nicotine Metabolite: NEGATIVE ng/mL (ref <5.0)
Opiates: NEGATIVE ng/mL (ref <2.5)
Phencyclidine: NEGATIVE ng/mL (ref <10)
Tapentadol: NEGATIVE ng/mL (ref <5.0)
Tramadol: 309 ng/mL — ABNORMAL HIGH (ref <5.0)
Tramadol: POSITIVE ng/mL — AB (ref <5.0)
Zolpidem: NEGATIVE ng/mL (ref <5.0)

## 2022-08-20 ENCOUNTER — Telehealth: Payer: Self-pay | Admitting: *Deleted

## 2022-08-20 NOTE — Telephone Encounter (Signed)
Oral swab drug screen was consistent for prescribed medications.  ?

## 2022-09-01 ENCOUNTER — Telehealth: Payer: Self-pay

## 2022-09-01 MED ORDER — DIAZEPAM 5 MG PO TABS: 5.0000 mg | ORAL_TABLET | Freq: Every day | ORAL | 0 refills | Status: DC

## 2022-09-01 NOTE — Telephone Encounter (Signed)
[name] stated the Prednisone & Valium worked well together. His pain was relieved by 70%. He would like a refill if possible? Patient believes he will have better results with another round of both medications. But may be only a 7 day supply of valium.   Call back phone [phone].

## 2022-09-01 NOTE — Telephone Encounter (Signed)
Patient informed. 

## 2022-09-02 LAB — LAB REPORT - SCANNED: EGFR: 103

## 2022-09-25 ENCOUNTER — Other Ambulatory Visit: Payer: Self-pay | Admitting: Physical Medicine and Rehabilitation

## 2022-11-11 ENCOUNTER — Encounter: Payer: Self-pay | Admitting: Physical Medicine and Rehabilitation

## 2022-11-11 ENCOUNTER — Encounter: Payer: Medicare Other | Attending: Registered Nurse | Admitting: Physical Medicine and Rehabilitation

## 2022-11-11 VITALS — BP 111/77 | HR 63 | Ht 70.0 in

## 2022-11-11 DIAGNOSIS — K592 Neurogenic bowel, not elsewhere classified: Secondary | ICD-10-CM | POA: Diagnosis present

## 2022-11-11 DIAGNOSIS — G8929 Other chronic pain: Secondary | ICD-10-CM | POA: Diagnosis present

## 2022-11-11 DIAGNOSIS — R252 Cramp and spasm: Secondary | ICD-10-CM | POA: Insufficient documentation

## 2022-11-11 DIAGNOSIS — Z993 Dependence on wheelchair: Secondary | ICD-10-CM | POA: Diagnosis present

## 2022-11-11 DIAGNOSIS — G8250 Quadriplegia, unspecified: Secondary | ICD-10-CM | POA: Diagnosis present

## 2022-11-11 DIAGNOSIS — M545 Low back pain, unspecified: Secondary | ICD-10-CM | POA: Diagnosis present

## 2022-11-11 MED ORDER — TRAMADOL HCL 50 MG PO TABS: 50.0000 mg | ORAL_TABLET | Freq: Four times a day (QID) | ORAL | 5 refills | Status: DC | PRN

## 2022-11-11 NOTE — Patient Instructions (Signed)
Patient is a 47 yr old male with C6 Quadriplegia, neurogenic bowel and bladder, and spasticity here for  F/u on spasticity and quadriplegia. Also has Autonomic dysreflexia. Grandmother had colon cancer. Also has chronic back pain- on Tramadol.   Here for f/u on SCI.    Last Oral drug screen 3/24- was appropriate- so won't need drug screen today.   2. Doing clean out at home for colonoscopy is usually impossible for SCI patients- because needs to be cleaned out for 4-7 days- hasn't seen GI yet-  Will place referral to [facility].   3. Con't Tramadol- will send in refill since came in for appts in March and this month- 120 tabs and 5 refills   4. Can continue Periactin- but can cause pts to gain weight since causes them to be hungry.  Can wean off if need be-    5. F/u on SCI and pain in back- double appt- SCI

## 2022-11-11 NOTE — Progress Notes (Signed)
Subjective:    Patient ID: Christopher Valencia, male    DOB: [birth year], 47 y.o.   MRN: [id number]  HPI  Patient is a 47 yr old male with C6 Quadriplegia, neurogenic bowel and bladder, and spasticity here for  F/u on spasticity and quadriplegia. Also has Autonomic dysreflexia. Grandmother had colon cancer. Also has chronic back pain- on Tramadol.   Here for f/u on SCI.     Back pain doing better after prednisone and Valium Kept healing after took Prednisone.   Still taking tramadol- but hasn't taken in 1 week since not pain.  Comes off and on- but more off "lately".   Hospital bed is cause of back pain- doesn't get good night's sleep in that awful bed.   But pain form bed really bothers him frequently.   Has tried mattress toppers or gel overlay-  Doesn't occur when on bed in hotel room ,so thinking about getting spring- regular mattress, not specific to hospital bed    Didn't get colonoscopy.   Doesn't need Cyproheptadine filled- still taking- but not a lot since causes weight gain.    Takes Periactin 1/2 pill and 5 mg in Am and in PM- so doesn't need refills.   Pain Inventory Average Pain 1 Pain Right Now 1 My pain is constant, sharp, stabbing, tingling, and aching  LOCATION OF PAIN  WRIST, FINGERS, BACK  BOWEL Number of stools per week: 7 Oral laxative use Yes  Type of laxative Magic Bullet Enema or suppository use Yes    BLADDER Normal and Pads  Bladder incontinence Yes     Mobility ability to climb steps?  no do you drive?  no use a wheelchair needs help with transfers Do you have any goals in this area?  yes  Function disabled: date disabled 10/1998 I need assistance with the following:  feeding, dressing, bathing, toileting, meal prep, household duties, and shopping Do you have any goals in this area?  yes  Neuro/Psych bladder control problems bowel control problems weakness numbness tingling spasms  Prior Studies Any changes since last visit?   no  Physicians involved in your care Any changes since last visit?  no   Family History  Problem Relation Age of Onset   Hypertension Mother    Hypertension Father    Social History   Socioeconomic History   Marital status: Single    Spouse name: Not on file   Number of children: Not on file   Years of education: Not on file   Highest education level: Not on file  Occupational History   Not on file  Tobacco Use   Smoking status: Never   Smokeless tobacco: Never  Vaping Use   Vaping Use: Never used  Substance and Sexual Activity   Alcohol use: No   Drug use: No   Sexual activity: Not Currently  Other Topics Concern   Not on file  Social History Narrative   Not on file   Social Determinants of Health   Financial Resource Strain: Not on file  Food Insecurity: Not on file  Transportation Needs: Not on file  Physical Activity: Not on file  Stress: Not on file  Social Connections: Not on file   Past Surgical History:  Procedure Laterality Date   ABDOMINAL SURGERY     BACK SURGERY     Past Medical History:  Diagnosis Date   Paraplegia following spinal cord injury (HCC)    Spinal cord injury, C5-C7 (HCC)    BP  111/77   Pulse 63   Ht 5\' 10"  (1.778 m)   SpO2 95%   BMI 25.83 kg/m   Opioid Risk Score:   Fall Risk Score:  `1  Depression screen [facility] 2/9     11/11/2022   11:30 AM 08/10/2022   11:37 AM 03/12/2021   11:02 AM 08/26/2020    2:41 PM 09/13/2019   11:19 AM 10/04/2018   10:30 AM  Depression screen PHQ 2/9  Decreased Interest 0 0 0 0 0 0  Down, Depressed, Hopeless 0 0 0 0 0 0  PHQ - 2 Score 0 0 0 0 0 0    Review of Systems  Gastrointestinal:        Bowel program  Genitourinary:        Incontinence   Neurological:  Positive for weakness and numbness.       Spasms, tingling  All other systems reviewed and are negative.      Objective:   Physical Exam Awake, alert, appropriate, in power w/c; brightly dressed today, NAD  Neuro: No  significant increased tone in LE's- MAS of 1- no clonus; no extensor tone  MS; no change in strength today-        Assessment & Plan:   Patient is a 47 yr old male with C6 Quadriplegia, neurogenic bowel and bladder, and spasticity here for  F/u on spasticity and quadriplegia. Also has Autonomic dysreflexia. Grandmother had colon cancer. Also has chronic back pain- on Tramadol.   Here for f/u on SCI.    Last Oral drug screen 3/24- was appropriate- so won't need drug screen today.   2. Doing clean out at home for colonoscopy is usually impossible for SCI patients- because needs to be cleaned out for 4-7 days- hasn't seen GI yet-  Will place referral to [facility].   3. Con't Tramadol- will send in refill since came in for appts in March and this month- 120 tabs and 5 refills   4. Can continue Periactin- but can cause pts to gain weight since causes them to be hungry.  Can wean off if need be-    5. F/u on SCI and pain in back- f/ double appt- SCI   I spent a total of  33   minutes on total care today- >50% coordination of care- due to  D/w pt about colonoscopy- and getting cleaned out- takes 4-7 days to get cleaned out- and d/w pt about back pain. Also education on Periactin- on weight gain- and also referral to GI.

## 2022-11-25 ENCOUNTER — Encounter: Payer: Self-pay | Admitting: Gastroenterology

## 2023-01-15 ENCOUNTER — Telehealth: Payer: Self-pay | Admitting: *Deleted

## 2023-01-15 MED ORDER — PREDNISONE 20 MG PO TABS: 20.0000 mg | ORAL_TABLET | Freq: Every day | ORAL | 0 refills | Status: DC

## 2023-01-15 MED ORDER — DIAZEPAM 5 MG PO TABS: 5.0000 mg | ORAL_TABLET | Freq: Every day | ORAL | 0 refills | Status: DC

## 2023-01-15 NOTE — Telephone Encounter (Signed)
Patient says he has pulled a muscle in his back again. He is requesting Prednisone and whatever else you prescribed the last time this happened. [facility]  Patient phone # [phone]

## 2023-01-21 NOTE — Progress Notes (Signed)
Chief Complaint: screening colonoscopy Primary GI MD: Gentry Fitz  HPI:  47 year old male history of with C6 Quadriplegia, neurogenic bowel and bladder, presents for evaluation of screening colonscopy.  Previously seen by [facility] in [facility].  Last office visit with them was 2022 for change in bowel habits.  Of note, Initial consult in 2018  with [facility] showed physical exam of rectoanal fistula and patient was referred to surgery.  He was seen by surgery and they felt it was a shallow ulcer or nonpigmented healed area so no surgery was pursued.  He also had a bout of mild colitis per CT with no symptoms 2022.  States he has function/feeling from above his navel. Often has sensation in his legs as well though resides in a wheelchair. Works regularly with spinal cord injury specialist with PT. Overall no issues today. Would like to get a colonoscopy. Not interested in stool studies such as cologuard.  Family history of colon cancer in maternal grandmother, late 64s. Lives at home with parents.  Past Medical History:  Diagnosis Date   Autonomic dysreflexia    Neurogenic bladder    Neurogenic bowel    Paraplegia following spinal cord injury (HCC)    Spinal cord injury, C5-C7 (HCC)     Past Surgical History:  Procedure Laterality Date   ABDOMINAL SURGERY     BACK SURGERY      Current Outpatient Medications  Medication Sig Dispense Refill   baclofen (LIORESAL) 10 MG tablet TAKE 1 TABLET BY MOUTH 3 TIMES DAILY. FOR SPASTICITY 270 tablet 1   clotrimazole-betamethasone (LOTRISONE) cream SMARTSIG:0.5 Inch(es) Topical Twice Daily     cyproheptadine (PERIACTIN) 4 MG tablet TAKE 0.5-1 TABLETS (2-4 MG TOTAL) BY MOUTH 3 (THREE) TIMES DAILY AS NEEDED (SPASMS). 90 tablet 5   diazepam (VALIUM) 5 MG tablet Take 1 tablet (5 mg total) by mouth at bedtime. For pulled muscle in back 14 tablet 0   furosemide (LASIX) 40 MG tablet Take 40 mg by mouth daily as needed.      Incontinence Supplies (BARD CENTER ENTRY CLOSE SYSTEM) MISC      predniSONE (DELTASONE) 20 MG tablet Take 1 tablet (20 mg total) by mouth daily with breakfast. For pulled muscle in back 4 tablet 0   traMADol (ULTRAM) 50 MG tablet Take 1 tablet (50 mg total) by mouth every 6 (six) hours as needed. For chronic back pain 120 tablet 5   Vitamins C E (CRANBERRY URINARY COMFORT) 100-3 MG-UNIT CAPS Take by mouth.     No current facility-administered medications for this visit.    Allergies as of 01/22/2023   (No Known Allergies)    Family History  Problem Relation Age of Onset   Hypertension Mother    Hypertension Father     Social History   Socioeconomic History   Marital status: Single    Spouse name: Not on file   Number of children: Not on file   Years of education: Not on file   Highest education level: Not on file  Occupational History   Not on file  Tobacco Use   Smoking status: Never   Smokeless tobacco: Never  Vaping Use   Vaping status: Never Used  Substance and Sexual Activity   Alcohol use: No   Drug use: No   Sexual activity: Not Currently  Other Topics Concern   Not on file  Social History Narrative   Not on file   Social Determinants of Health  Financial Resource Strain: Not on file  Food Insecurity: Not on file  Transportation Needs: Not on file  Physical Activity: Not on file  Stress: Not on file  Social Connections: Not on file  Intimate Partner Violence: Not on file    Review of Systems:    Constitutional: No weight loss, fever, chills, weakness or fatigue HEENT: Eyes: No change in vision               Ears, Nose, Throat:  No change in hearing or congestion Skin: No rash or itching Cardiovascular: No chest pain, chest pressure or palpitations   Respiratory: No SOB or cough Gastrointestinal: See HPI and otherwise negative Genitourinary: No dysuria or change in urinary frequency Neurological: No headache, dizziness or syncope Musculoskeletal:  No new muscle or joint pain Hematologic: No bleeding or bruising Psychiatric: No history of depression or anxiety    Physical Exam:  Vital signs: There were no vitals taken for this visit.  Constitutional: NAD, Well developed, Well nourished, alert and cooperative. Comfortably sitting in motorized wheelchair. Head:  Normocephalic and atraumatic. Eyes:   PEERL, EOMI. No icterus. Conjunctiva pink. Respiratory: Respirations even and unlabored. Lungs clear to auscultation bilaterally.   No wheezes, crackles, or rhonchi.  Cardiovascular:  Regular rate and rhythm. No peripheral edema, cyanosis or pallor.  Gastrointestinal:  Soft, nondistended, nontender. No rebound or guarding. Normal bowel sounds. No appreciable masses or hepatomegaly. Rectal:  Not performed.  Neurologic:  Alert and  oriented x4 Skin:   Dry and intact without significant lesions or rashes. Psychiatric: Oriented to person, place and time. Demonstrates good judgement and reason without abnormal affect or behaviors.   RELEVANT LABS AND IMAGING: CBC    Component Value Date/Time   WBC 5.5 03/06/2020 0913   RBC 4.66 03/06/2020 0913   HGB 12.9 (L) 03/06/2020 0913   HCT 40.2 03/06/2020 0913   PLT 449 (H) 03/06/2020 0913   MCV 86.3 03/06/2020 0913   MCH 27.7 03/06/2020 0913   MCHC 32.1 03/06/2020 0913   RDW 17.2 (H) 03/06/2020 0913   LYMPHSABS 1.1 02/27/2020 1200   MONOABS 0.9 02/27/2020 1200   EOSABS 0.0 02/27/2020 1200   BASOSABS 0.1 02/27/2020 1200    CMP     Component Value Date/Time   NA 134 (L) 03/06/2020 0913   K 4.2 03/06/2020 0913   CL 100 03/06/2020 0913   CO2 24 03/06/2020 0913   GLUCOSE 89 03/06/2020 0913   BUN 7 03/06/2020 0913   CREATININE 0.91 03/06/2020 0913   CALCIUM 8.7 (L) 03/06/2020 0913   PROT 4.9 (L) 03/05/2020 0426   ALBUMIN 2.3 (L) 03/05/2020 0426   AST 51 (H) 03/05/2020 0426   ALT 90 (H) 03/05/2020 0426   ALKPHOS 34 (L) 03/05/2020 0426   BILITOT 0.3 03/05/2020 0426   GFRNONAA >60  03/06/2020 0913   GFRAA >60 03/06/2020 0913     Assessment/Plan:   Neurogenic bowel Special screening for malignant neoplasms, colon FHx: colon cancer Due for initial screening colonoscopy. No symptoms. Not interested in cologuard. Will need to be admitted for prep as he is unable to prep on his own. Due to a current muscle strain he plans to call us back to schedule at a later date. Just wanted to establish care. --- colonoscopy scheduling when he calls [place] back and is able --- will have to be done at the hospital --- I thoroughly discussed the procedure with the patient (at bedside) to include nature of the procedure, alternatives, benefits, and  risks (including but not limited to bleeding, infection, perforation, anesthesia/cardiac pulmonary complications).  Patient verbalized understanding and gave verbal consent to proceed with procedure.  --- assigned to Dr. Chales Abrahams today --- no recent labs. Check CBC/CMP    Delynda Sepulveda Jolee Ewing [facility] 01/21/2023, 9:52 AM  Cc: Ralene Ok, MD

## 2023-01-22 ENCOUNTER — Encounter: Payer: Self-pay | Admitting: Gastroenterology

## 2023-01-22 ENCOUNTER — Other Ambulatory Visit (INDEPENDENT_AMBULATORY_CARE_PROVIDER_SITE_OTHER): Payer: Medicare Other

## 2023-01-22 ENCOUNTER — Ambulatory Visit: Payer: Medicare Other | Admitting: Gastroenterology

## 2023-01-22 VITALS — BP 108/68 | HR 76 | Ht 70.0 in

## 2023-01-22 DIAGNOSIS — Z1211 Encounter for screening for malignant neoplasm of colon: Secondary | ICD-10-CM | POA: Diagnosis not present

## 2023-01-22 DIAGNOSIS — K592 Neurogenic bowel, not elsewhere classified: Secondary | ICD-10-CM | POA: Diagnosis not present

## 2023-01-22 DIAGNOSIS — Z800 Family history of malignant neoplasm of digestive organs: Secondary | ICD-10-CM | POA: Diagnosis not present

## 2023-01-22 LAB — CBC WITH DIFFERENTIAL/PLATELET
Basophils Absolute: 0.1 10*3/uL (ref 0.0–0.1)
Basophils Relative: 0.9 % (ref 0.0–3.0)
Eosinophils Absolute: 0.1 10*3/uL (ref 0.0–0.7)
Eosinophils Relative: 1.6 % (ref 0.0–5.0)
HCT: 39.2 % (ref 39.0–52.0)
Hemoglobin: 12.5 g/dL — ABNORMAL LOW (ref 13.0–17.0)
Lymphocytes Relative: 20.1 % (ref 12.0–46.0)
Lymphs Abs: 1.3 10*3/uL (ref 0.7–4.0)
MCHC: 32 g/dL (ref 30.0–36.0)
MCV: 83.2 fl (ref 78.0–100.0)
Monocytes Absolute: 0.3 10*3/uL (ref 0.1–1.0)
Monocytes Relative: 5.1 % (ref 3.0–12.0)
Neutro Abs: 4.7 10*3/uL (ref 1.4–7.7)
Neutrophils Relative %: 72.3 % (ref 43.0–77.0)
Platelets: 251 10*3/uL (ref 150.0–400.0)
RBC: 4.7 Mil/uL (ref 4.22–5.81)
RDW: 16.4 % — ABNORMAL HIGH (ref 11.5–15.5)
WBC: 6.5 10*3/uL (ref 4.0–10.5)

## 2023-01-22 LAB — COMPREHENSIVE METABOLIC PANEL
ALT: 23 U/L (ref 0–53)
AST: 18 U/L (ref 0–37)
Albumin: 3.8 g/dL (ref 3.5–5.2)
Alkaline Phosphatase: 58 U/L (ref 39–117)
BUN: 11 mg/dL (ref 6–23)
CO2: 24 mEq/L (ref 19–32)
Calcium: 8.7 mg/dL (ref 8.4–10.5)
Chloride: 96 mEq/L (ref 96–112)
Creatinine, Ser: 0.82 mg/dL (ref 0.40–1.50)
GFR: 106.26 mL/min (ref >60.00)
Glucose, Bld: 96 mg/dL (ref 70–99)
Potassium: 3.3 mEq/L — ABNORMAL LOW (ref 3.5–5.1)
Sodium: 128 mEq/L — ABNORMAL LOW (ref 135–145)
Total Bilirubin: 0.5 mg/dL (ref 0.2–1.2)
Total Protein: 6.5 g/dL (ref 6.0–8.3)

## 2023-01-22 NOTE — Patient Instructions (Signed)
_______________________________________________________  If your blood pressure at your visit was 140/90 or greater, please contact your primary care physician to follow up on this.  _______________________________________________________  If you are age 45 or older, your body mass index should be between 23-30. Your Body mass index is 25.83 kg/m. If this is out of the aforementioned range listed, please consider follow up with your Primary Care Provider.  If you are age 17 or younger, your body mass index should be between 19-25. Your Body mass index is 25.83 kg/m. If this is out of the aformentioned range listed, please consider follow up with your Primary Care Provider.   ________________________________________________________  The [facility] providers would like to encourage you to use [facility] to communicate with providers for non-urgent requests or questions.  Due to long hold times on the telephone, sending your provider a message by [facility] may be a faster and more efficient way to get a response.  Please allow 48 business hours for a response.  Please remember that this is for non-urgent requests.  _______________________________________________________  Your provider has requested that you go to the basement level for lab work before leaving today. Press "B" on the elevator. The lab is located at the first door on the left as you exit the elevator.  Due to recent changes in healthcare laws, you may see the results of your imaging and laboratory studies on MyChart before your provider has had a chance to review them.  We understand that in some cases there may be results that are confusing or concerning to you. Not all laboratory results come back in the same time frame and the provider may be waiting for multiple results in order to interpret others.  Please give [place] 48 hours in order for your provider to thoroughly review all the results before contacting the office for clarification of  your results.   Please call the office when you are ready to move forward with the colonoscopy.   It was a pleasure to see you today!  Thank you for trusting me with your gastrointestinal care!

## 2023-01-27 NOTE — Progress Notes (Signed)
Colon at [facility] Agree with assessment/plan.  Edman Circle, MD [facility] [phone]

## 2023-02-17 ENCOUNTER — Encounter: Payer: Medicare Other | Attending: Registered Nurse | Admitting: Physical Medicine and Rehabilitation

## 2023-02-17 ENCOUNTER — Encounter: Payer: Self-pay | Admitting: Physical Medicine and Rehabilitation

## 2023-02-17 VITALS — BP 89/61 | HR 74 | Ht 70.0 in | Wt 185.0 lb

## 2023-02-17 DIAGNOSIS — Z993 Dependence on wheelchair: Secondary | ICD-10-CM | POA: Diagnosis present

## 2023-02-17 DIAGNOSIS — M545 Low back pain, unspecified: Secondary | ICD-10-CM | POA: Diagnosis not present

## 2023-02-17 DIAGNOSIS — R252 Cramp and spasm: Secondary | ICD-10-CM | POA: Diagnosis present

## 2023-02-17 DIAGNOSIS — G8929 Other chronic pain: Secondary | ICD-10-CM | POA: Diagnosis present

## 2023-02-17 DIAGNOSIS — G8250 Quadriplegia, unspecified: Secondary | ICD-10-CM | POA: Insufficient documentation

## 2023-02-17 NOTE — Progress Notes (Signed)
Subjective:    Patient ID: Christopher Valencia, male    DOB: 1976-04-03, 47 y.o.   MRN: [id number]  HPI Patient is a 47 yr old male with C6 Quadriplegia, neurogenic bowel and bladder, and spasticity here for  F/u on spasticity and quadriplegia. Also has Autonomic dysreflexia. Grandmother had colon cancer. Also has chronic back pain- on Tramadol.   Here for f/u on SCI.     Hasn't scheduled GI colonoscopy yet- wants ot know what's going on.    Just injured back again- didn't work as well to take Prednisone and Valium-  Vit D - based on research could help, so increased Vit D level from 2000 units to 4000 units/day.   Taking Tramadol- but now out- finished yesterday.  Usually takes 2 at once- max 4-6 tabs/day- but not always that many- usually 4 pills/day  Back to taking V8 juice- helps  Tramadol helps his pain better than Oxy- since that mainly makes him sleepy.   CAP program- has CAP worker- CAP has a budget to help with vans/hospital items- before goes to [facility].  Has a Zenaida Niece, but needs to replace the ramp-     Pain Inventory Average Pain 2 Pain Right Now 4 My pain is aching  LOCATION OF PAIN  back  BOWEL Number of stools per week: 7 Oral laxative use No  Type of laxative . Enema or suppository use  magic bullet History of colostomy No  Incontinent No   BLADDER Pads In and out cath, frequency . Able to self cath  . Bladder incontinence Yes  Frequent urination No  Leakage with coughing No  Difficulty starting stream No  Incomplete bladder emptying No    Mobility how many minutes can you walk? 0 ability to climb steps?  no do you drive?  no use a wheelchair needs help with transfers  Function disabled: date disabled . I need assistance with the following:  bathing, toileting, meal prep, household duties, and shopping  Neuro/Psych bladder control problems bowel control problems weakness numbness tremor tingling spasms  Prior Studies Any  changes since last visit?  no  Physicians involved in your care Any changes since last visit?  no   Family History  Problem Relation Age of Onset   Hypertension Mother    Hypertension Father    Esophageal cancer Brother    Colon cancer Maternal Grandmother    Rectal cancer Neg Hx    Stomach cancer Neg Hx    Liver cancer Neg Hx    Pancreatic cancer Neg Hx    Social History   Socioeconomic History   Marital status: Single    Spouse name: Not on file   Number of children: Not on file   Years of education: Not on file   Highest education level: Not on file  Occupational History   Not on file  Tobacco Use   Smoking status: Never   Smokeless tobacco: Never  Vaping Use   Vaping status: Never Used  Substance and Sexual Activity   Alcohol use: Yes    Comment: Rare   Drug use: No   Sexual activity: Not Currently  Other Topics Concern   Not on file  Social History Narrative   Not on file   Social Determinants of Health   Financial Resource Strain: Not on file  Food Insecurity: Not on file  Transportation Needs: Not on file  Physical Activity: Not on file  Stress: Not on file  Social Connections: Not on  file   Past Surgical History:  Procedure Laterality Date   ABDOMINAL SURGERY     BACK SURGERY     Past Medical History:  Diagnosis Date   Autonomic dysreflexia    Neurogenic bladder    Neurogenic bowel    Paraplegia following spinal cord injury (HCC)    Spinal cord injury, C5-C7 (HCC)    BP (!) 89/61   Pulse 74   Ht 5\' 10"  (1.778 m)   Wt 185 lb (83.9 kg)   SpO2 95%   BMI 26.54 kg/m   Opioid Risk Score:   Fall Risk Score:  `1  Depression screen [facility] 2/9     11/11/2022   11:30 AM 08/10/2022   11:37 AM 03/12/2021   11:02 AM 08/26/2020    2:41 PM 09/13/2019   11:19 AM 10/04/2018   10:30 AM  Depression screen [facility] 2/9  Decreased Interest 0 0 0 0 0 0  Down, Depressed, Hopeless 0 0 0 0 0 0  PHQ - 2 Score 0 0 0 0 0 0     Review of Systems  Gastrointestinal:   Positive for constipation.  Genitourinary:  Positive for decreased urine volume.  Musculoskeletal:  Positive for back pain.       Spasms  Neurological:  Positive for tremors and weakness.  All other systems reviewed and are negative.     Objective:   Physical Exam Awake, alert, appropriate, in power w/c; new w/c- NAD In recline posiiton       Assessment & Plan:   Patient is a 47 yr old male with C6 Quadriplegia (20 years ago), neurogenic bowel and bladder, and spasticity here for  F/u on spasticity and quadriplegia. Also has Autonomic dysreflexia. Grandmother had colon cancer. Also has chronic back pain- on Tramadol.   Here for f/u on SCI.     Last tramadol filled was 6/5- but hadn't gotten refilled since then- has refills can get though- taking 2-4 tabs/day- let me know when it's to be refilled. Has refill at pharmacy.    2. Can continue Periactin/Cyproheptadine- taking 1/2 pill TID- but doesn't need refills- call me when does and will refill.    3. Doing Baclofen 5 mg TID- not 10 mg TID- so lasting longer-  Used to cause constipation- but better with lower dose- so doesn't need refill.   4.  Goes through [employer]- needs appointment with therapist- needs therapist to see if needs van/ramp.  Will replace ramp via this process- for Zenaida Niece- d/w Bufford Lope at [facility] to schedule this-  will place referral Rehab without [facility]- Bufford Lope- [facility]- make sure they know medicare is primary- so can be evaluated for ramp for w/c Zenaida Niece- will need Fleet Contras there from [place] as well- per CAP protocol.   5.  Using w/c that got last year from [facility]- is 47 year old.    6.  SCI support- group- 6-7 pm last Thursday of the month- [address]- will not be held in September, but will restart in October. To help you AND new SCI's.   7. Doesn't need prednisone/Valium- only takes when hurts back- ~ 2x/year max.   8.  F/U in 3 months- double visit- SCI and chronic  pain   9. Doesn't need oral drug screen today- isn't due   I spent a total of  34  minutes on total care today- >50% coordination of care- due to d/w Audra/PT at [facility]hab without [facility] and W/C/van specialist Barbara Cower-  from room- also d/w pt about meds, tramadol and drug screen- as well as SCI support group- went over it.

## 2023-02-17 NOTE — Patient Instructions (Addendum)
Patient is a 47 yr old male with C6 Quadriplegia (20 years ago), neurogenic bowel and bladder, and spasticity here for  F/u on spasticity and quadriplegia. Also has Autonomic dysreflexia. Grandmother had colon cancer. Also has chronic back pain- on Tramadol.   Here for f/u on SCI.     Last tramadol filled was 6/5- but hadn't gotten refilled since then- has refills can get though- taking 2-4 tabs/day- let me know when it's to be refilled. Has refill at pharmacy.    2. Can continue Periactin/Cyproheptadine- taking 1/2 pill TID- but doesn't need refills- call me when does and will refill.    3. Doing Baclofen 5 mg TID- not 10 mg TID- so lasting longer-  Used to cause constipation- but better with lower dose- so doesn't need refill.   4.  Goes through [employer]- needs appointment with therapist- needs therapist to see if needs van/ramp.  Will replace ramp via this process- for Zenaida Niece- d/w Bufford Lope at [facility] to schedule this-  will place referral Rehab without [facility]- Bufford Lope- In [facility]- make sure they know medicare is primary- so can be evaluated for ramp for w/c Zenaida Niece- will need Fleet Contras there from [place] as well- per CAP protocol.   5.  Using w/c that got last year from [facility]- is 47 year old.    6.  SCI support- group- 6-7 pm last Thursday of the month- [address]- will not be held in September, but will restart in October. To help you AND new SCI's.   7. Doesn't need prednisone/Valium- only takes when hurts back- ~ 2x/year max.   8.  F/U in 3 months- double visit- SCI and chronic pain

## 2023-04-02 ENCOUNTER — Telehealth: Payer: Self-pay | Admitting: Specialist

## 2023-04-02 NOTE — Telephone Encounter (Signed)
Patient of dr. Berline Chough He has tried to refill his tramadol 2 times in the past few months and they will not refill.  He does not understand why.  Please call [facility] [address] in [place], [place], OT/L [phone]

## 2023-04-07 MED ORDER — TRAMADOL HCL 50 MG PO TABS: 50.0000 mg | ORAL_TABLET | Freq: Four times a day (QID) | ORAL | 5 refills | Status: DC | PRN

## 2023-04-07 NOTE — Telephone Encounter (Signed)
[facility] and [facility] are [facility] ready for him now. I have notified Mr Bastien.

## 2023-05-21 ENCOUNTER — Encounter: Payer: Medicare Other | Attending: Registered Nurse | Admitting: Physical Medicine and Rehabilitation

## 2023-05-21 ENCOUNTER — Encounter: Payer: Self-pay | Admitting: Physical Medicine and Rehabilitation

## 2023-05-21 VITALS — BP 98/61 | HR 74 | Ht 70.0 in

## 2023-05-21 DIAGNOSIS — Z5181 Encounter for therapeutic drug level monitoring: Secondary | ICD-10-CM

## 2023-05-21 DIAGNOSIS — Z79891 Long term (current) use of opiate analgesic: Secondary | ICD-10-CM

## 2023-05-21 DIAGNOSIS — N319 Neuromuscular dysfunction of bladder, unspecified: Secondary | ICD-10-CM

## 2023-05-21 DIAGNOSIS — K592 Neurogenic bowel, not elsewhere classified: Secondary | ICD-10-CM

## 2023-05-21 DIAGNOSIS — Z993 Dependence on wheelchair: Secondary | ICD-10-CM

## 2023-05-21 DIAGNOSIS — G8250 Quadriplegia, unspecified: Secondary | ICD-10-CM

## 2023-05-21 DIAGNOSIS — G894 Chronic pain syndrome: Secondary | ICD-10-CM

## 2023-05-21 MED ORDER — CYPROHEPTADINE HCL 4 MG PO TABS: 2.0000 mg | ORAL_TABLET | Freq: Three times a day (TID) | ORAL | 1 refills | Status: DC | PRN

## 2023-05-21 MED ORDER — BACLOFEN 10 MG PO TABS: 5.0000 mg | ORAL_TABLET | Freq: Three times a day (TID) | ORAL | 1 refills | Status: DC

## 2023-05-21 NOTE — Progress Notes (Signed)
Subjective:    Patient ID: Christopher Valencia, male    DOB: 21-Jul-1975, 47 y.o.   MRN: [id number]  HPI Patient is a 47 yr old male with C6 Quadriplegia- in 2000, neurogenic bowel and bladder, and spasticity here for  F/u on spasticity and quadriplegia. Also has Autonomic dysreflexia. Grandmother had colon cancer. Also has chronic back pain- on Tramadol.   Here for f/u on SCI.    Tramadol got sorted out- getting 1 month at a time again.   Still taking 4 pills/day as needed.   Still asking- if has Zoom meeting for SCI support group.    Back pain- just like it's been- waiting til gets prednisone back.  Hasn't quite healed, so still bothering him- will wait til February and then ask for Prednisone.    Is due for Oral drug screen  Hasn't seen therapy lately- didn't set it up- talked to [place], but never started.   Hospital bed seems to set it off- and sleeps the "wrong way".   Doing HEP- daily.   Takes Periactin 1/2 tab 4x/day- for spasms- help sSO much- was even able to reduce baclofen.    Is at age for colonoscopy- went to see GI- October 2024- plans to get January/February-  usually puts people in the hospital to get cleaned out for bowel prep.   Use briefs but urinates on his own.  Doesn't see Urology anymore -got renal u/s 2 years ago His Urologist moved to [place].   Does suppository- daily- no dig stim  Drinks V8 daily- and eats a lot of veggies.  Doesn't eat a lot of processed foods.   Pain Inventory Average Pain 4 Pain Right Now 2 My pain is constant, sharp, stabbing, tingling, and aching  LOCATION OF PAIN  lower back  BOWEL Number of stools per week: 7 Oral laxative use No  Type of laxative No Enema or suppository use  Magic Bullet History of colostomy No    BLADDER Pads Bladder incontinence Yes     Mobility ability to climb steps?  no do you drive?  no use a wheelchair needs help with transfers Do you have any goals in this area?   yes   Function disabled: date disabled 2000 I need assistance with the following:  dressing, bathing, toileting, meal prep, household duties, and shopping Do you have any goals in this area?  yes  Neuro/Psych bladder control problems bowel control problems weakness numbness tremor tingling trouble walking spasms  Prior Studies Any changes since last visit?  no  Physicians involved in your care Any changes since last visit?  yes   Family History  Problem Relation Age of Onset   Hypertension Mother    Hypertension Father    Esophageal cancer Brother    Colon cancer Maternal Grandmother    Rectal cancer Neg Hx    Stomach cancer Neg Hx    Liver cancer Neg Hx    Pancreatic cancer Neg Hx    Social History   Socioeconomic History   Marital status: Single    Spouse name: Not on file   Number of children: Not on file   Years of education: Not on file   Highest education level: Not on file  Occupational History   Not on file  Tobacco Use   Smoking status: Never   Smokeless tobacco: Never  Vaping Use   Vaping status: Never Used  Substance and Sexual Activity   Alcohol use: Yes    Comment: Rare  Drug use: No   Sexual activity: Not Currently  Other Topics Concern   Not on file  Social History Narrative   Not on file   Social Drivers of Health   Financial Resource Strain: Not on file  Food Insecurity: Not on file  Transportation Needs: Not on file  Physical Activity: Not on file  Stress: Not on file  Social Connections: Not on file   Past Surgical History:  Procedure Laterality Date   ABDOMINAL SURGERY     BACK SURGERY     Past Medical History:  Diagnosis Date   Autonomic dysreflexia    Neurogenic bladder    Neurogenic bowel    Paraplegia following spinal cord injury (HCC)    Spinal cord injury, C5-C7 (HCC)    There were no vitals taken for this visit.  Opioid Risk Score:   Fall Risk Score:  `1  Depression screen [facility] 2/9     11/11/2022    11:30 AM 08/10/2022   11:37 AM 03/12/2021   11:02 AM 08/26/2020    2:41 PM 09/13/2019   11:19 AM 10/04/2018   10:30 AM  Depression screen [facility] 2/9  Decreased Interest 0 0 0 0 0 0  Down, Depressed, Hopeless 0 0 0 0 0 0  PHQ - 2 Score 0 0 0 0 0 0    Review of Systems  Gastrointestinal:  Positive for constipation.  Genitourinary:        Incontinence  Neurological:  Positive for tremors, weakness and numbness.       Tingling,spasms  All other systems reviewed and are negative.      Objective:   Physical Exam        Assessment & Plan:   Patient is a 47 yr old male with C6 Quadriplegia in 2000, neurogenic bowel and bladder, and spasticity here for  F/u on spasticity and quadriplegia. Also has Autonomic dysreflexia. Grandmother had colon cancer. Also has chronic back pain- on Tramadol.   Here for f/u on SCI.    Due for Oral drug screen today. Is already done- waiting for results.   2. Wants to take Prednisone again in February- and see if that will help back again - last prescribed early August- so February makes sense.    3.  Will renew- Cyproheptadine  1/2 tab 4x/day as needed for spasms   4. Con't Baclofen- renewed- 5-10 mg 3x/day #270- 1 refill    5. Referral to Urology- Dr Shela Leff-  Floyde Parkins III- so look for his office to call you. See to make sure no kidney damage- cystoscopy, etc.  Not having any symptoms.    6. Bowel program suppository - no dig stim- con't regimen- working well. Will get colonoscopy done early 2025- to be admitted to do.    7. Tramadol- con't 50 mg 4x/day- doesn't need refills- con't regimen   8. Gets briefs from PCP- and incontinence supplies- no issues with this set up.    9.  Needs to have PCP assess you cardiovascular risk - at least start with EKG- and possibly a calcium score or dobutamine or medication stress test- cannot be exercise stress test.  BP 98/61- today- d/w pt- he eats a lot of veggies and V8- and helps bowels as well as  tries to help reduce risk of Cardiovascular disease. The amount of time from injury is more indicative of CVD than actual age- so since it's been 25 years, need to do extra work up. Says he gets Cholesterol  and A1c checked at PCP. No SOB, no LE edema; not tired and no chest pain, no  L arm pain    10.  If has chest pain, might not actually have chest pain- could just have L arm or shoulder or neck or jaw pain.  FYI.   11. F/U - 3 months- double SCI   I spent a total of 36   minutes on total care today- >50% coordination of care- due to  d/w pt about Colonoscopy as well need to work up possible cardiovascular diseases and research about SCI patients

## 2023-05-21 NOTE — Patient Instructions (Signed)
Patient is a 47 yr old male with C6 Quadriplegia in 2000, neurogenic bowel and bladder, and spasticity here for  F/u on spasticity and quadriplegia. Also has Autonomic dysreflexia. Grandmother had colon cancer. Also has chronic back pain- on Tramadol.   Here for f/u on SCI.    Due for Oral drug screen today. Is already done- waiting for results.   2. Wants to take Prednisone again in February- and see if that will help back again - last prescribed early August- so February makes sense.    3.  Will renew- Cyproheptadine  1/2 tab 4x/day as needed for spasms   4. Con't Baclofen- renewed- 5-10 mg 3x/day #270- 1 refill    5. Referral to Urology- Dr Shela Leff-  Floyde Parkins III- so look for his office to call you. See to make sure no kidney damage- cystoscopy, etc.  Not having any symptoms.    6. Bowel program suppository - no dig stim- con't regimen- working well. Will get colonoscopy done early 2025- to be admitted to do.    7. Tramadol- con't 50 mg 4x/day- doesn't need refills- con't regimen   8. Gets briefs from PCP- and incontinence supplies- no issues with this set up.    9.  Needs to have PCP assess you cardiovascular risk - at least start with EKG- and possibly a calcium score or dobutamine or medication stress test- cannot be exercise stress test.  BP 98/61- today- d/w pt- he eats a lot of veggies and V8- and helps bowels as well as tries to help reduce risk of Cardiovascular disease. The amount of time from injury is more indicative of CVD than actual age- so since it's been 25 years, need to do extra work up. Says he gets Cholesterol and A1c checked at PCP. No SOB, no LE edema; not tired and no chest pain, no  L arm pain    10.  If has chest pain, might not actually have chest pain- could just have L arm or shoulder or neck or jaw pain.  FYI.   11. F/U - 3 months- double SCI

## 2023-05-25 LAB — DRUG TOX MONITOR 1 W/CONF, ORAL FLD

## 2023-05-25 LAB — DRUG TOX ALC METAB W/CON, ORAL FLD: Alcohol Metabolite: NEGATIVE ng/mL (ref <25)

## 2023-07-15 ENCOUNTER — Telehealth: Payer: Self-pay | Admitting: Physical Medicine and Rehabilitation

## 2023-07-15 NOTE — Telephone Encounter (Signed)
 Patient called in requesting medication refill on prednisone  and diazepam  (VALIUM ) 5 MG tablet  , states he usually needs them at least twice a year and would like it sent [facility] on file

## 2023-07-16 MED ORDER — DIAZEPAM 5 MG PO TABS: 5.0000 mg | ORAL_TABLET | Freq: Every day | ORAL | 0 refills | Status: DC

## 2023-07-16 MED ORDER — PREDNISONE 20 MG PO TABS: 20.0000 mg | ORAL_TABLET | Freq: Every day | ORAL | 0 refills | Status: AC

## 2023-07-16 NOTE — Addendum Note (Signed)
 Addended by: Shayann Garbutt on: 07/16/2023 04:10 PM   Modules accepted: Orders

## 2023-08-27 ENCOUNTER — Encounter: Payer: Self-pay | Admitting: Physical Medicine and Rehabilitation

## 2023-08-27 ENCOUNTER — Encounter: Payer: Medicare Other | Attending: Registered Nurse | Admitting: Physical Medicine and Rehabilitation

## 2023-08-27 VITALS — BP 93/64 | HR 66 | Ht 70.0 in

## 2023-08-27 DIAGNOSIS — Z993 Dependence on wheelchair: Secondary | ICD-10-CM | POA: Diagnosis present

## 2023-08-27 DIAGNOSIS — R252 Cramp and spasm: Secondary | ICD-10-CM | POA: Insufficient documentation

## 2023-08-27 DIAGNOSIS — M545 Low back pain, unspecified: Secondary | ICD-10-CM | POA: Insufficient documentation

## 2023-08-27 DIAGNOSIS — G894 Chronic pain syndrome: Secondary | ICD-10-CM | POA: Insufficient documentation

## 2023-08-27 DIAGNOSIS — G8929 Other chronic pain: Secondary | ICD-10-CM | POA: Insufficient documentation

## 2023-08-27 DIAGNOSIS — G8250 Quadriplegia, unspecified: Secondary | ICD-10-CM | POA: Insufficient documentation

## 2023-08-27 MED ORDER — FUROSEMIDE 40 MG PO TABS: 40.0000 mg | ORAL_TABLET | Freq: Every day | ORAL | Status: AC | PRN

## 2023-08-27 MED ORDER — TRAMADOL HCL 50 MG PO TABS: 50.0000 mg | ORAL_TABLET | Freq: Four times a day (QID) | ORAL | 5 refills | Status: DC | PRN

## 2023-08-27 NOTE — Progress Notes (Signed)
 Subjective:    Patient ID: Christopher Valencia, male    DOB: 19-Aug-1975, 48 y.o.   MRN: [id number]  HPI Patient is a 48 yr old male with C6 Quadriplegia- in 2000, neurogenic bowel and bladder, and spasticity here for  F/u on spasticity and quadriplegia. Also has Autonomic dysreflexia. Grandmother had colon cancer. Also has chronic back pain- on Tramadol.   Here for f/u on SCI.    Took Prednisone in February- helped a lot- like it did last time.    Something about bed is really bothering him-  Christopher Valencia is about 48 years old.  Bought a spring mattress for hospital bed- did well in beginning- but is time to get something new. Can feel the springs.    Not helping back at all!  Can wake up 1 day and be perfect and next day, like jumped off a bridge, in terms of back pain.   Had to find mattress himself.    Hasn't had a pressure ulcer in 20+ years- and doesn't plan on having one!   Did Oral drug screen in 05/2023  Saw Dr Katrinka Blazing- Urology- liked him-  Been 10 years since had last UTI- set up for yearly visit- and to let him know when sees renal doc.     Back spasms of back- and squeezes every muscle in body.  Doesn't drink sodas or take NSAIDs and takes Cyproheptadine for spasms.  Takes garlic and Cranberry supplements and MVI.  Only other meds he takes is baclofen and tramadol-  Doesn't take Lasix anymore- was on meds list  Drinking more lately and sees Renal doc April 2nd.  Set up by PCP.   Does take Lasix - last time took was 1 month ago- and usually takes 1-2 days, then stops.   W/C controller is "messed up"- needs to be fixed.  W/c is 81-9 years old.  Cushion doing OK No other w/c issues.   Pain Inventory Average Pain 4 Pain Right Now 4 My pain is constant, sharp, burning, dull, stabbing, tingling, and aching  LOCATION OF PAIN  lower back  BOWEL Number of stools per week: 7 Oral laxative use No  Type of laxative none Enema or suppository use Yes  History of  colostomy No  Incontinent Yes   BLADDER Pads Bladder incontinence Yes     Mobility ability to climb steps?  no do you drive?  no use a wheelchair needs help with transfers Do you have any goals in this area?  yes  Function disabled: date disabled 2000 I need assistance with the following:  dressing, bathing, toileting, meal prep, household duties, and shopping Do you have any goals in this area?  yes  Neuro/Psych weakness numbness tremor tingling trouble walking spasms  Prior Studies Any changes since last visit?  yes kidney ultrasound at [facility] or [facility]  Physicians involved in your care Any changes since last visit?  no   Family History  Problem Relation Age of Onset   Hypertension Mother    Hypertension Father    Esophageal cancer Brother    Colon cancer Maternal Grandmother    Rectal cancer Neg Hx    Stomach cancer Neg Hx    Liver cancer Neg Hx    Pancreatic cancer Neg Hx    Social History   Socioeconomic History   Marital status: Single    Spouse name: Not on file   Number of children: Not on file   Years of education: Not on file  Highest education level: Not on file  Occupational History   Not on file  Tobacco Use   Smoking status: Never   Smokeless tobacco: Never  Vaping Use   Vaping status: Never Used  Substance and Sexual Activity   Alcohol use: Yes    Comment: Rare   Drug use: No   Sexual activity: Not Currently  Other Topics Concern   Not on file  Social History Narrative   Not on file   Social Drivers of Health   Financial Resource Strain: Medium Risk (08/19/2023)   Received from [facility]   Overall Financial Resource Strain (CARDIA)    Difficulty of Paying Living Expenses: Somewhat hard  Food Insecurity: No Food Insecurity (08/19/2023)   Received from [facility]   Hunger Vital Sign    Worried About Running Out of Food in the Last Year: Never true    Ran Out of Food in the Last Year: Never true   Transportation Needs: No Transportation Needs (08/19/2023)   Received from [facility] - Transportation    Lack of Transportation (Medical): No    Lack of Transportation (Non-Medical): No  Physical Activity: Not on file  Stress: Not on file  Social Connections: Not on file   Past Surgical History:  Procedure Laterality Date   ABDOMINAL SURGERY     BACK SURGERY     Past Medical History:  Diagnosis Date   Autonomic dysreflexia    Neurogenic bladder    Neurogenic bowel    Paraplegia following spinal cord injury (HCC)    Spinal cord injury, C5-C7 (HCC)    There were no vitals taken for this visit.  Opioid Risk Score:   Fall Risk Score:  `1  Depression screen [facility] 2/9     05/21/2023   12:52 PM 11/11/2022   11:30 AM 08/10/2022   11:37 AM 03/12/2021   11:02 AM 08/26/2020    2:41 PM 09/13/2019   11:19 AM 10/04/2018   10:30 AM  Depression screen PHQ 2/9  Decreased Interest 0 0 0 0 0 0 0  Down, Depressed, Hopeless 0 0 0 0 0 0 0  PHQ - 2 Score 0 0 0 0 0 0 0    Review of Systems  Musculoskeletal:  Positive for gait problem.       Spasms  Neurological:  Positive for tremors, weakness and numbness.       Tingling  All other systems reviewed and are negative.      Objective:   Physical Exam  Awake, alert, appropriate, in power w/c; joystick cable is jury rigged with zip tie to keep it working; front  and back wheels/casters are almost bald- large tires are OK- no problems there.       Assessment & Plan:   Patient is a 48 yr old male with C6 Quadriplegia- in 2000, neurogenic bowel and bladder, and spasticity here for  F/u on spasticity and quadriplegia. Also has Autonomic dysreflexia. Grandmother had colon cancer. Also has chronic back pain- on Tramadol.   Here for f/u on SCI.    Don't think insurance will cover a spring mattress  2. Lasix/Furosemide- suggest not taking often- take 1 day and see how it goes for edema, before takes more- could be cause of  kidney  issues being sent to Kidney doctor. Has Lasix 40 mg daily prn- and takes rarely, but could be cause of kidney issues when last checked labs.    3. Last Oral drug screen 05/2023-  was appropriate- doesn't need to be done today.    4. Con't Cyproheptadine and Baclofen- has prescriptions from 05/21/23- so doesn't need refills today.    5. Last given Valium and Prednisone for back pain was 07/16/23- so will try to keep to 2x/year.    6. Con't Cranberry pills supplements to prevent UTI's and Multivitamin.    7. Will write Rx for w/c maintenance- to fix his joystick and replace his casters front and back.    8. F/U in 3 months double appt- SCI due ot chronic pain in low back as well   9. With your kidney issues, anti-inflammatories could make it worse-   I spent a total of 25   minutes on total care today- >50% coordination of care- due to  d/w with [employer] rep- about joystick replacement vs fixing- and cster replacement- also treatment for renal issue-s going to renal doc- and education that NSAIDs can make things worse.

## 2023-08-27 NOTE — Patient Instructions (Addendum)
 Patient is a 48 yr old male with C6 Quadriplegia- in 2000, neurogenic bowel and bladder, and spasticity here for  F/u on spasticity and quadriplegia. Also has Autonomic dysreflexia. Grandmother had colon cancer. Also has chronic back pain- on Tramadol.   Here for f/u on SCI.    Don't think insurance will cover a spring mattress  2. Lasix/Furosemide- suggest not taking often- take 1 day and see how it goes for edema, before takes more- could be cause of  kidney issues being sent to Kidney doctor. Has Lasix 40 mg daily prn- and takes rarely, but could be cause of kidney issues when last checked labs.    3. Last Oral drug screen 05/2023- was appropriate- doesn't need to be done today.    4. Con't Cyproheptadine and Baclofen- has prescriptions from 05/21/23- so doesn't need refills today.    5. Last given Valium and Prednisone for back pain was 07/16/23- so will try to keep to 2x/year.    6. Con't Cranberry pills supplements to prevent UTI's and Multivitamin.    7. Will write Rx for w/c maintenance- to fix his joystick and replace his casters front and back.    8. F/U in 3 months double appt- SCI due ot chronic pain in low back as well   9. With your kidney issues, anti-inflammatories could make it worse-

## 2023-12-17 ENCOUNTER — Encounter: Attending: Physical Medicine and Rehabilitation | Admitting: Physical Medicine and Rehabilitation

## 2023-12-17 ENCOUNTER — Encounter: Payer: Self-pay | Admitting: Physical Medicine and Rehabilitation

## 2023-12-17 VITALS — BP 87/61 | HR 64 | Ht 70.0 in

## 2023-12-17 DIAGNOSIS — Z993 Dependence on wheelchair: Secondary | ICD-10-CM | POA: Diagnosis present

## 2023-12-17 DIAGNOSIS — Z5181 Encounter for therapeutic drug level monitoring: Secondary | ICD-10-CM | POA: Insufficient documentation

## 2023-12-17 DIAGNOSIS — G894 Chronic pain syndrome: Secondary | ICD-10-CM | POA: Insufficient documentation

## 2023-12-17 DIAGNOSIS — G8250 Quadriplegia, unspecified: Secondary | ICD-10-CM | POA: Insufficient documentation

## 2023-12-17 DIAGNOSIS — R252 Cramp and spasm: Secondary | ICD-10-CM | POA: Insufficient documentation

## 2023-12-17 DIAGNOSIS — K592 Neurogenic bowel, not elsewhere classified: Secondary | ICD-10-CM | POA: Insufficient documentation

## 2023-12-17 DIAGNOSIS — Z79891 Long term (current) use of opiate analgesic: Secondary | ICD-10-CM | POA: Insufficient documentation

## 2023-12-17 MED ORDER — TRAMADOL HCL 50 MG PO TABS: 50.0000 mg | ORAL_TABLET | Freq: Four times a day (QID) | ORAL | 5 refills | Status: DC | PRN

## 2023-12-17 MED ORDER — CYPROHEPTADINE HCL 4 MG PO TABS: 2.0000 mg | ORAL_TABLET | Freq: Three times a day (TID) | ORAL | 1 refills | Status: DC | PRN

## 2023-12-17 MED ORDER — BACLOFEN 10 MG PO TABS: 5.0000 mg | ORAL_TABLET | Freq: Three times a day (TID) | ORAL | 1 refills | Status: DC

## 2023-12-17 NOTE — Patient Instructions (Signed)
 Patient is a 48 yr old male with C6 Quadriplegia- in 2000, neurogenic bowel and bladder, and spasticity here for  F/u on spasticity and quadriplegia. Also has Autonomic dysreflexia. Grandmother had colon cancer. Also has chronic back pain- on Tramadol .   Here for f/u on SCI.    Does Cyproheptadine   2-3x/day-  makes you hungrier, of note , but helps spasms  2. Will have him try Melatonin- no more than 10 mg of note- I like  gummies , because they have a faster onset I've seen- if that doesn't work, call me and can try Trazodone-    3. Last refill of Tramadol  50 mg- last filled 3/21- but has 6 refills left at pharmacy is telling him it's too soon to get refilled- and last filled 09/17/23- we discussed this- he will call pharmacy to look into it.  Sent in another refill- however is due for refill- so something concerning.     4. Try to turn all electronics off 1+ hour before you go to sleep- went over the rhythm of sleep and how lights off make brain/pineal gland know it's time to go to sleep.    5. Con't Baclofen  10 mg sometimes takes only 1x/day- but written for 3x/day- can go to 1/2 tab if need be- if it relaxes muscle too much.  Can cause more constipation.   6. Last Oral drug screen 05/21/23- not due today - per clinic policy.   7.  Isn't interested in Botox, etc or more spasticity meds for severe tone- will let me know if he gets to a point he wants it.    8.  F/U in 3months- double appt- SCI   9. Bowel program going well- no changes; and bladder is fine/going OK per Urology

## 2023-12-17 NOTE — Progress Notes (Signed)
 Subjective:    Patient ID: Christopher Valencia, male    DOB: 08/17/1975, 48 y.o.   MRN: [id number]  HPI  Patient is a 48 yr old male with C6 Quadriplegia- in 2000, neurogenic bowel and bladder, and spasticity here for  F/u on spasticity and quadriplegia. Also has Autonomic dysreflexia. Grandmother had colon cancer. Also has chronic back pain- on Tramadol .   Here for f/u on SCI.    Sleeps better without foam on top, needs foam because pressure sores risk.  Likes bed  more firm- helps back but makes butt sore- concerned about ulcers.   W/C - in 3rd-4th year.  Has gel cushion- Public librarian-  Never had pressure ulcers- but concerned about them   Back is about the same- didn't heal with steroids last time- but will see next time- last Prednisone  and Valium  was 07/16/23- so wants to do again in August.   When sleep isn't good, back hurts worse.  Valium  is for the muscle spasms and also helps him sleep better.    When gets solid sleep, back pain better.  Sleeps ok, but doesn't get REM sleep as much as should- sleeps for 3 hours solid/night- and rest of time napping.    Eats greens 3x/week- and fiber- bowel program- in AM- and done daily- has results daily- parents do bowel program.   Use the briefs for bladder- has overflow incontinence/spontaneous kick off- saw Dr Claudene- will see yearly.   Kidneys- doing better when doesn't take Lasix  very much.   Approved to get new ramp for van- through [facility] program- hhas just got letter- so now needs to get appointments to get it done.  [facility]- not [facility]-   Last Oral drug screen- 05/21/23 Was good.   Was told has vision loss in R eye- still gone- was told had stroke when getting surgery for SCI.  So always weaker on L side.  Took HALO off, and put in cervical collar- after anterior fusion-  turned on side and caused neck to get displaced- and needed posterior fusion and has been worse since then.   Makes the best out of Spasticity- since happy that  gets a workout with spasticity and spasms- and tightness keeps better muscle tone. Can go off for 5+ minutes.  And lets him do it, so can get a workout.   Pain Inventory Average Pain 3 Pain Right Now 3 My pain is constant, sharp, stabbing, tingling, and aching  In the last 24 hours, has pain interfered with the following? General activity 5 Relation with others 2 Enjoyment of life 5 What TIME of day is your pain at its worst? morning , daytime, and evening Sleep (in general) Fair  Pain is worse with: sitting Pain improves with: rest, therapy/exercise, and medication Relief from Meds: 8  Family History  Problem Relation Age of Onset   Hypertension Mother    Hypertension Father    Esophageal cancer Brother    Colon cancer Maternal Grandmother    Rectal cancer Neg Hx    Stomach cancer Neg Hx    Liver cancer Neg Hx    Pancreatic cancer Neg Hx    Social History   Socioeconomic History   Marital status: Single    Spouse name: Not on file   Number of children: Not on file   Years of education: Not on file   Highest education level: Not on file  Occupational History   Not on file  Tobacco Use   Smoking  status: Never   Smokeless tobacco: Never  Vaping Use   Vaping status: Never Used  Substance and Sexual Activity   Alcohol use: Yes    Comment: Rare   Drug use: No   Sexual activity: Not Currently  Other Topics Concern   Not on file  Social History Narrative   Not on file   Social Drivers of Health   Financial Resource Strain: Medium Risk (08/19/2023)   Received from [facility]   Overall Financial Resource Strain (CARDIA)    Difficulty of Paying Living Expenses: Somewhat hard  Food Insecurity: No Food Insecurity (08/19/2023)   Received from [facility]   Hunger Vital Sign    Within the past 12 months, you worried that your food would run out before you got the money to buy more.: Never true    Within the past 12 months, the food you bought just didn't last  and you didn't have money to get more.: Never true  Transportation Needs: No Transportation Needs (08/19/2023)   Received from [facility] - Transportation    Lack of Transportation (Medical): No    Lack of Transportation (Non-Medical): No  Physical Activity: Not on file  Stress: Not on file  Social Connections: Not on file   Past Surgical History:  Procedure Laterality Date   ABDOMINAL SURGERY     BACK SURGERY     Past Surgical History:  Procedure Laterality Date   ABDOMINAL SURGERY     BACK SURGERY     Past Medical History:  Diagnosis Date   Autonomic dysreflexia    Neurogenic bladder    Neurogenic bowel    Paraplegia following spinal cord injury (HCC)    Spinal cord injury, C5-C7 (HCC)    BP (!) 87/61   Pulse 64   Ht 5' 10 (1.778 m)   SpO2 95%   BMI 26.54 kg/m   Opioid Risk Score:   Fall Risk Score:  `1  Depression screen PHQ 2/9     12/17/2023    1:47 PM 08/27/2023    1:07 PM 05/21/2023   12:52 PM 11/11/2022   11:30 AM 08/10/2022   11:37 AM 03/12/2021   11:02 AM 08/26/2020    2:41 PM  Depression screen PHQ 2/9  Decreased Interest 0 0 0 0 0 0 0  Down, Depressed, Hopeless 0 0 0 0 0 0 0  PHQ - 2 Score 0 0 0 0 0 0 0     Review of Systems  Musculoskeletal:  Positive for back pain.  All other systems reviewed and are negative.      Objective:   Physical Exam  Awake, alert, appropriate, in power w/c; wearing sandals; R joystick, NAD Visual deficits on R side Contractures of fingers at PIPs B/L L worse than R  LLE> RLE edema- trace on RLE and trace to 1+ in LLE (L side is weaker per pt)   Neuro: MAS of 4 in LLE; MAS of 2-3 in RLE- no clonus-       Assessment & Plan:   Patient is a 48 yr old male with C6 Quadriplegia- in 2000, neurogenic bowel and bladder, and spasticity here for  F/u on spasticity and quadriplegia. Also has Autonomic dysreflexia. Grandmother had colon cancer. Also has chronic back pain- on Tramadol .   Here for f/u on SCI.     Does Cyproheptadine   2-3x/day-  makes you hungrier, of note , but helps spasms  2. Will have him try Melatonin- no  more than 10 mg of note- I like  gummies , because they have a faster onset I've seen- if that doesn't work, call me and can try Trazodone-    3. Last refill of Tramadol  50 mg- last filled 3/21- but has 6 refills left at pharmacy is telling him it's too soon to get refilled- and last filled 09/17/23- we discussed this- he will call pharmacy to look into it.  Sent in another refill- however is due for refill- so something concerning.     4. Try to turn all electronics off 1+ hour before you go to sleep- went over the rhythm of sleep and how lights off make brain/pineal gland know it's time to go to sleep.    5. Con't Baclofen  10 mg sometimes takes only 1x/day- but written for 3x/day- can go to 1/2 tab if need be- if it relaxes muscle too much.  Can cause more constipation.   6. Last Oral drug screen 05/21/23- not due today - per clinic policy.   7.  Isn't interested in Botox, etc or more spasticity meds for severe tone- will let me know if he gets to a point he wants it.    8.  F/U in 3 months- double appt- SCI   9. Bowel program going well- no changes; and bladder is fine/going OK per Urology    I spent a total of  34  minutes on total care today- >50% coordination of care- due to d/w pt about : W/C, spasticity, and refills of meds; will call me when ready next month for Steroids and Valium ;  and sleep as well as back pain.

## 2024-01-21 ENCOUNTER — Telehealth: Payer: Self-pay

## 2024-01-21 MED ORDER — DIAZEPAM 5 MG PO TABS: 5.0000 mg | ORAL_TABLET | Freq: Every day | ORAL | 0 refills | Status: AC

## 2024-01-21 MED ORDER — PREDNISONE 20 MG PO TABS: 20.0000 mg | ORAL_TABLET | Freq: Every day | ORAL | 0 refills | Status: AC

## 2024-01-21 NOTE — Telephone Encounter (Signed)
 Patient called requesting a refill on Prednisone and Diazepam. [facility] in [place]

## 2024-03-20 ENCOUNTER — Encounter: Payer: Self-pay | Admitting: Physical Medicine and Rehabilitation

## 2024-03-20 ENCOUNTER — Encounter: Attending: Physical Medicine and Rehabilitation | Admitting: Physical Medicine and Rehabilitation

## 2024-03-20 VITALS — BP 135/85 | HR 61 | Ht 70.0 in

## 2024-03-20 DIAGNOSIS — R252 Cramp and spasm: Secondary | ICD-10-CM | POA: Insufficient documentation

## 2024-03-20 DIAGNOSIS — N319 Neuromuscular dysfunction of bladder, unspecified: Secondary | ICD-10-CM | POA: Insufficient documentation

## 2024-03-20 DIAGNOSIS — S14106S Unspecified injury at C6 level of cervical spinal cord, sequela: Secondary | ICD-10-CM | POA: Diagnosis not present

## 2024-03-20 DIAGNOSIS — G8250 Quadriplegia, unspecified: Secondary | ICD-10-CM | POA: Diagnosis present

## 2024-03-20 DIAGNOSIS — G894 Chronic pain syndrome: Secondary | ICD-10-CM | POA: Insufficient documentation

## 2024-03-20 DIAGNOSIS — Z5181 Encounter for therapeutic drug level monitoring: Secondary | ICD-10-CM | POA: Insufficient documentation

## 2024-03-20 DIAGNOSIS — Z79891 Long term (current) use of opiate analgesic: Secondary | ICD-10-CM | POA: Insufficient documentation

## 2024-03-20 DIAGNOSIS — Z993 Dependence on wheelchair: Secondary | ICD-10-CM | POA: Insufficient documentation

## 2024-03-20 NOTE — Patient Instructions (Signed)
 Patient is a 48 yr old male with C6 Quadriplegia- in 2000, neurogenic bowel and bladder, and spasticity here for  F/u on spasticity and quadriplegia. Also has Autonomic dysreflexia. Grandmother had colon cancer. Also has chronic back pain- on Tramadol .   Here for f/u on SCI.    Uses [facility] for w/c- got w/c for pt in 02/2021-   2. Needs to get Colonoscopy!!!! Seriously!!!!! SCI patients don't have increased RISK of colon cancer, they have increased risk from Colon cancer!   3. Needs to see kidney doctor  [facility] and urology- Kidney and Dr Marinell Sharps 08/2023- so due next year.    4.   For winter, doing Zoom or something like that meeting for SCI support group- starting in November til March- but last meeting is  October 30th at 6-7 pm-  at [phone]-  go into rehab door towards the gym- 1st floor conference room.   Parking in parking garage.   5. Due for Oral drug screen-  per clinic policy.   6. Con't tramadol - last filled 12/17/23-   7. Con't Periactin  - last refill 12/17/23- not due for refill.   8. Con't Baclofen  5-10 mg TID- for spasticity- last refil 12/17/23  9. F/U in 3 months double appt- SCI

## 2024-03-20 NOTE — Progress Notes (Signed)
 Subjective:    Patient ID: Christopher Valencia, male    DOB: Jun 21, 1975, 48 y.o.   MRN: [id number]  HPI Patient is a 48 yr old male with C6 Quadriplegia- in 2000, neurogenic bowel and bladder, and spasticity here for  F/u on spasticity and quadriplegia. Also has Autonomic dysreflexia. Grandmother had colon cancer. Also has chronic back pain- on Tramadol .   Here for f/u on SCI.   Doing well- cannot complain.   Pain levels- pretty good- not having a lot of pain.   Finding new ways to turn in hospital bed.   Has had work done on [employer]-  Financial planner replaced on w/c Had stopped working- stuck in air! Tilt mechanisms was messed up.  Uses [facility]  Got new ramp in w/c van.  [place]- did original fleeta, so needed to go back through them.   Spasticity about the same Can go off for 5+ minutes- depends on positioning- until moves.   Prednisone  healed it up When gets full nights sleep- pain gets so much better.   Still haven't got Colonoscopy- needed to do last year.    Needs to see kidney physician- early 2025- saw 09/07/23- Urology saw 3/25 or  09/2023  W/C doing well now  Doesn't really go out to bars anymore! Be at home and unbothered.  Goes to mall every now and then.    Uses van to get appt.   Wears pads- in depends-  doesn't cath- empties all the way out! Does bowel program qAM - someone else helps.   Pain Inventory Average Pain 3 Pain Right Now 1 My pain is intermittent, sharp, burning, stabbing, tingling, and aching  In the last 24 hours, has pain interfered with the following? General activity 2 Relation with others 0 Enjoyment of life 2 What TIME of day is your pain at its worst? morning , daytime, and evening Sleep (in general) Fair  Pain is worse with: sitting and inactivity Pain improves with: rest, therapy/exercise, and medication Relief from Meds: na  Family History  Problem Relation Age of Onset   Hypertension Mother    Hypertension Father    Esophageal cancer  Brother    Colon cancer Maternal Grandmother    Rectal cancer Neg Hx    Stomach cancer Neg Hx    Liver cancer Neg Hx    Pancreatic cancer Neg Hx    Social History   Socioeconomic History   Marital status: Single    Spouse name: Not on file   Number of children: Not on file   Years of education: Not on file   Highest education level: Not on file  Occupational History   Not on file  Tobacco Use   Smoking status: Never   Smokeless tobacco: Never  Vaping Use   Vaping status: Never Used  Substance and Sexual Activity   Alcohol use: Yes    Comment: Rare   Drug use: No   Sexual activity: Not Currently  Other Topics Concern   Not on file  Social History Narrative   Not on file   Social Drivers of Health   Financial Resource Strain: Medium Risk (08/19/2023)   Received from [facility]   Overall Financial Resource Strain (CARDIA)    Difficulty of Paying Living Expenses: Somewhat hard  Food Insecurity: No Food Insecurity (08/19/2023)   Received from [facility]   Hunger Vital Sign    Within the past 12 months, you worried that your food would run out before you got  the money to buy more.: Never true    Within the past 12 months, the food you bought just didn't last and you didn't have money to get more.: Never true  Transportation Needs: No Transportation Needs (08/19/2023)   Received from [facility]   PRAPARE - Transportation    Lack of Transportation (Medical): No    Lack of Transportation (Non-Medical): No  Physical Activity: Not on file  Stress: Not on file  Social Connections: Not on file   Past Surgical History:  Procedure Laterality Date   ABDOMINAL SURGERY     BACK SURGERY     Past Surgical History:  Procedure Laterality Date   ABDOMINAL SURGERY     BACK SURGERY     Past Medical History:  Diagnosis Date   Autonomic dysreflexia    Neurogenic bladder    Neurogenic bowel    Paraplegia following spinal cord injury (HCC)    Spinal cord injury, C5-C7  (HCC)    BP 135/85   Pulse 61   Ht 5' 10 (1.778 m)   SpO2 92%   BMI 26.54 kg/m   Opioid Risk Score:   Fall Risk Score:  `1  Depression screen PHQ 2/9     03/20/2024    2:00 PM 12/17/2023    1:47 PM 08/27/2023    1:07 PM 05/21/2023   12:52 PM 11/11/2022   11:30 AM 08/10/2022   11:37 AM 03/12/2021   11:02 AM  Depression screen PHQ 2/9  Decreased Interest 0 0 0 0 0 0 0  Down, Depressed, Hopeless 0 0 0 0 0 0 0  PHQ - 2 Score 0 0 0 0 0 0 0     Review of Systems  Musculoskeletal:  Positive for back pain.  All other systems reviewed and are negative.      Objective:   Physical Exam  Awake, alert, appropriate, jovial , in power w/c; NAD Spasticity- MAS LLE and MAS 3 in RLE- no clonus still      Assessment & Plan:   Patient is a 48 yr old male with C6 Quadriplegia- in 2000, neurogenic bowel and bladder, and spasticity here for  F/u on spasticity and quadriplegia. Also has Autonomic dysreflexia. Grandmother had colon cancer. Also has chronic back pain- on Tramadol .   Here for f/u on SCI.    Uses [facility] for w/c- got w/c for pt in 02/2021-   2. Needs to get Colonoscopy!!!! Seriously!!!!! SCI patients don't have increased RISK of colon cancer, they have increased risk from Colon cancer!   3. Needs to see kidney doctor  [facility] and urology- Kidney and Dr Marinell Sharps 08/2023- so due next year.    4.   For winter, doing Zoom or something like that meeting for SCI support group- starting in November til March- but last meeting is  October 30th at 6-7 pm-  [address]-  go into rehab door towards the gym- 1st floor conference room.   Parking in parking garage.   5. Due for Oral drug screen-  per clinic policy.   6. Con't tramadol - last filled 12/17/23-   7. Con't Periactin  - last refill 12/17/23- not due for refill.   8. Con't Baclofen  5-10 mg TID- for spasticity- last refil 12/17/23  9. F/U in 3 months double appt- SCI  I spent a total of  34    minutes on total care today- >50% coordination of care- due to d/w pt about colonoscopy secondary  to GM colon CA,  and wen tover meds- orders and we went over SCI support groups as detailed above.

## 2024-03-20 NOTE — Addendum Note (Signed)
 Addended by: Kohler Pellerito W on: 03/20/2024 02:38 PM   Modules accepted: Orders

## 2024-03-24 LAB — DRUG TOX MONITOR 1 W/CONF, ORAL FLD
Amphetamines: NEGATIVE ng/mL (ref <10)
Barbiturates: NEGATIVE ng/mL (ref <10)
Benzodiazepines: NEGATIVE ng/mL (ref <0.50)
Buprenorphine: NEGATIVE ng/mL (ref <0.10)
Cocaine: NEGATIVE ng/mL (ref <5.0)
Fentanyl: NEGATIVE ng/mL (ref <0.10)
Heroin Metabolite: NEGATIVE ng/mL (ref <1.0)
MARIJUANA: NEGATIVE ng/mL (ref <2.5)
MDMA: NEGATIVE ng/mL (ref <10)
Meprobamate: NEGATIVE ng/mL (ref <2.5)
Methadone: NEGATIVE ng/mL (ref <5.0)
Nicotine Metabolite: NEGATIVE ng/mL (ref <5.0)
Opiates: NEGATIVE ng/mL (ref <2.5)
Phencyclidine: NEGATIVE ng/mL (ref <10)
Tapentadol: NEGATIVE ng/mL (ref <5.0)
Tramadol: 476.3 ng/mL — ABNORMAL HIGH (ref <5.0)
Tramadol: POSITIVE ng/mL — AB (ref <5.0)
Zolpidem: NEGATIVE ng/mL (ref <5.0)

## 2024-03-24 LAB — DRUG TOX ALC METAB W/CON, ORAL FLD: Alcohol Metabolite: NEGATIVE ng/mL (ref <25)

## 2024-06-16 ENCOUNTER — Other Ambulatory Visit: Payer: Self-pay | Admitting: Physical Medicine and Rehabilitation

## 2024-06-19 NOTE — Telephone Encounter (Signed)
 Requested Prescriptions   Pending Prescriptions Disp Refills   baclofen  (LIORESAL ) 10 MG tablet [Pharmacy Med Name: BACLOFEN  10 MG TABLET] 270 tablet 1    Sig: TAKE 0.5-1 TABLETS (5-10 MG TOTAL) BY MOUTH 3 (THREE) TIMES DAILY.     Date of patient request: 06/19/2024 Last office visit: 03/20/2024 Upcoming visit: 06/23/2024 Date of last refill: 12/17/2023 Last refill amount: #270 1 refill

## 2024-06-23 ENCOUNTER — Encounter: Attending: Physical Medicine and Rehabilitation | Admitting: Physical Medicine and Rehabilitation

## 2024-06-23 ENCOUNTER — Encounter: Payer: Self-pay | Admitting: Physical Medicine and Rehabilitation

## 2024-06-23 VITALS — BP 113/72 | HR 62 | Ht 70.0 in

## 2024-06-23 DIAGNOSIS — R252 Cramp and spasm: Secondary | ICD-10-CM | POA: Diagnosis not present

## 2024-06-23 DIAGNOSIS — G8250 Quadriplegia, unspecified: Secondary | ICD-10-CM | POA: Insufficient documentation

## 2024-06-23 DIAGNOSIS — Z993 Dependence on wheelchair: Secondary | ICD-10-CM | POA: Insufficient documentation

## 2024-06-23 MED ORDER — TRAMADOL HCL 50 MG PO TABS: 50.0000 mg | ORAL_TABLET | Freq: Four times a day (QID) | ORAL | 5 refills | Status: AC | PRN

## 2024-06-23 MED ORDER — CYPROHEPTADINE HCL 4 MG PO TABS: 2.0000 mg | ORAL_TABLET | Freq: Three times a day (TID) | ORAL | 1 refills | Status: AC | PRN

## 2024-06-23 NOTE — Progress Notes (Signed)
 "  Subjective:    Patient ID: Christopher Valencia, male    DOB: [birth year], 49 y.o.   MRN: [id number]  HPI  Patient is a 49 yr old male with C6 Quadriplegia- in 2000, neurogenic bowel and bladder, and spasticity here for  F/u on spasticity and quadriplegia. Also has Autonomic dysreflexia. Grandmother had colon cancer. Also has chronic back pain- on Tramadol .   Here for f/u on SCI.     W/C going well- nothing broken- elevator working well.   Pain about the same- hasn't gotten worse- not really with weather.   Rain and cold weather doesn't set him off.   Still hasn't gotten Colonoscopy.   Not running from it, but lsoing weight ON PURPOSE, so can tolerate colonoscopy better.   Dad got sick and fleeta went haywire and needed to get fixed. Financially  has been burdened, so waited.   Needs to schedule colonoscopy!    Pain Inventory Average Pain 4 Pain Right Now 2 My pain is intermittent, constant, sharp, burning, dull, stabbing, tingling, and aching  In the last 24 hours, has pain interfered with the following? General activity 0 Relation with others 0 Enjoyment of life 1 What TIME of day is your pain at its worst? morning , daytime, evening, and night Sleep (in general) Good  Pain is worse with: sitting Pain improves with: rest and medication Relief from Meds: 8  Family History  Problem Relation Age of Onset   Hypertension Mother    Hypertension Father    Esophageal cancer Brother    Colon cancer Maternal Grandmother    Rectal cancer Neg Hx    Stomach cancer Neg Hx    Liver cancer Neg Hx    Pancreatic cancer Neg Hx    Social History   Socioeconomic History   Marital status: Single    Spouse name: Not on file   Number of children: Not on file   Years of education: Not on file   Highest education level: Not on file  Occupational History   Not on file  Tobacco Use   Smoking status: Never   Smokeless tobacco: Never  Vaping Use   Vaping status: Never Used  Substance and  Sexual Activity   Alcohol use: Not Currently    Comment: Rare   Drug use: No   Sexual activity: Not Currently  Other Topics Concern   Not on file  Social History Narrative   Not on file   Social Drivers of Health   Tobacco Use: Low Risk (03/20/2024)   Patient History    Smoking Tobacco Use: Never    Smokeless Tobacco Use: Never    Passive Exposure: Not on file  Financial Resource Strain: Medium Risk (08/19/2023)   Received from [facility]   Overall Financial Resource Strain (CARDIA)    Difficulty of Paying Living Expenses: Somewhat hard  Food Insecurity: No Food Insecurity (08/19/2023)   Received from [facility]   Epic    Within the past 12 months, you worried that your food would run out before you got the money to buy more.: Never true    Within the past 12 months, the food you bought just didn't last and you didn't have money to get more.: Never true  Transportation Needs: No Transportation Needs (08/19/2023)   Received from [facility] - Transportation    Lack of Transportation (Medical): No    Lack of Transportation (Non-Medical): No  Physical Activity: Not on file  Stress:  Not on file  Social Connections: Not on file  Depression (PHQ2-9): Low Risk (06/23/2024)   Depression (PHQ2-9)    PHQ-2 Score: 0  Alcohol Screen: Not on file  Housing: Low Risk (08/19/2023)   Received from [facility]    In the last 12 months, was there a time when you were not able to pay the mortgage or rent on time?: No    In the past 12 months, how many times have you moved where you were living?: 0    At any time in the past 12 months, were you homeless or living in a shelter (including now)?: No  Utilities: Not At Risk (08/19/2023)   Received from [facility] Utilities    Threatened with loss of utilities: No  Health Literacy: Not on file   Past Surgical History:  Procedure Laterality Date   ABDOMINAL SURGERY     BACK SURGERY     Past Surgical History:   Procedure Laterality Date   ABDOMINAL SURGERY     BACK SURGERY     Past Medical History:  Diagnosis Date   Autonomic dysreflexia    Neurogenic bladder    Neurogenic bowel    Paraplegia following spinal cord injury (HCC)    Spinal cord injury, C5-C7 (HCC)    BP 113/72   Pulse 62   Ht 5' 10 (1.778 m)   SpO2 95%   BMI 26.54 kg/m   Opioid Risk Score:   Fall Risk Score:  `1  Depression screen PHQ 2/9     06/23/2024    1:48 PM 03/20/2024    2:00 PM 12/17/2023    1:47 PM 08/27/2023    1:07 PM 05/21/2023   12:52 PM 11/11/2022   11:30 AM 08/10/2022   11:37 AM  Depression screen PHQ 2/9  Decreased Interest 0 0 0 0 0 0 0  Down, Depressed, Hopeless 0 0 0 0 0 0 0  PHQ - 2 Score 0 0 0 0 0 0 0    Review of Systems  Musculoskeletal:  Positive for back pain and gait problem.       Quadriplegic   All other systems reviewed and are negative.      Objective:   Physical Exam        Assessment & Plan:   Patient is a 49 yr old male with C6 Quadriplegia- in 2000, neurogenic bowel and bladder, and spasticity here for  F/u on spasticity and quadriplegia. Also has Autonomic dysreflexia. Grandmother had colon cancer. Also has chronic back pain- on Tramadol .   Here for f/u on SCI.      Needs to get Colonoscopy DONE!!!! Please schedule it.  Have increased risk of death from colon cancer- due to not getting screened.  Have a family history, so needs to get this done.   2.  Needs to make appointment for [facility] and Dr Marinell Sharps-  needs to see Dr Sharps before he retires.    3. SCI support meeting-  March last Thursday in March-  1st floor-  Also has online in 2 weeks Sent Email to SCI support group leader.    4. Con't Tramadol - needs new refill-   5. Sent in new Rx's for Baclofen -  last week so not due   6. Con't Periactin - for spasticity- can also make you gain weight- so can reduce dose if need to? Specifically has spasms in low back and abdomen    7. Pt  wants to refill Valium  and Prednisone  probably in February- only does ~ 2x/year and works well for back pain.    8. F/U - 3 months double appt- quadriplegia C6   9. W/C is working well- call me if issues.   10. Discussed current research issues and how it's affected.    11. Discussed flu and covid vaccines.   I spent a total of  34  minutes on total care today- >50% coordination of care- due to  d/w pt about scientific and research on SCI; and issues pertaining to that;  as well as colonoscopy need; and refills of meds- reviewed chart from last visit and appointments.      "

## 2024-06-23 NOTE — Patient Instructions (Addendum)
 Patient is a 49 yr old male with C6 Quadriplegia- in 2000, neurogenic bowel and bladder, and spasticity here for  F/u on spasticity and quadriplegia. Also has Autonomic dysreflexia. Grandmother had colon cancer. Also has chronic back pain- on Tramadol .   Here for f/u on SCI.      Needs to get Colonoscopy DONE!!!! Please schedule it.  Have increased risk of death from colon cancer- due to not getting screened.  Have a family history, so needs to get this done.   2.  Needs to make appointment for [facility] and Dr Marinell Sharps-  needs to see Dr Sharps before he retires.    3. SCI support meeting-  March last Thursday in March-  1st floor-  Also has online in 2 weeks Sent Email to SCI support group leader.    4. Con't Tramadol - needs new refill-   5. Sent in new Rx's for Baclofen -  last week so not due   6. Con't Periactin - for spasticity- can also make you gain weight- so can reduce dose if need to? Specifically has spasms in low back and abdomen    7. Pt wants to refill Valium  and Prednisone  probably in February- only does ~ 2x/year and works well for back pain.    8. F/U - 3 months double appt- quadriplegia C6   9. W/C is working well- call me if issues.    10. Discussed current research issues and how it's affected.    11. Discussed flu and covid vaccines.

## 2024-09-25 ENCOUNTER — Encounter: Admitting: Physical Medicine and Rehabilitation
